# Patient Record
Sex: Female | Born: 1995 | Hispanic: No | Marital: Married | Smoking: Never smoker
Health system: Southern US, Community
[De-identification: ages and names within clinical notes are randomized; demographics above are authoritative.]

## PROBLEM LIST (undated history)

## (undated) ENCOUNTER — Inpatient Hospital Stay (HOSPITAL_COMMUNITY): Payer: Self-pay

## (undated) ENCOUNTER — Emergency Department (HOSPITAL_COMMUNITY): Payer: 59

## (undated) DIAGNOSIS — Z789 Other specified health status: Secondary | ICD-10-CM

## (undated) HISTORY — PX: NO PAST SURGERIES: SHX2092

---

## 2016-08-20 NOTE — L&D Delivery Note (Signed)
Delivery Note At 3:01 AM a viable female was delivered via Vaginal, Spontaneous (Presentation: LOA).  APGAR: 8, 9; weight: pending .   Placenta status: spont ,intact .  Cord: 3 vessel  Anesthesia:  Epidural & 1% lidocaine Episiotomy: None   Lacerations: 1st degree;Perineal Suture Repair: 3.0 vicryl rapide Est. Blood Loss (mL): 100  Mom to postpartum.  Baby to Couplet care / Skin to Skin.  Cam HaiSHAW, Tara Chapman CNM 07/25/2017, 4:08 AM  Please schedule this patient for PP visit in: 4 weeks Low risk pregnancy complicated by: nothing Delivery mode:  SVD Anticipated Birth Control:  other/unsure PP Procedures needed: none  Schedule Integrated BH visit: no Provider: Any provider

## 2017-01-20 ENCOUNTER — Inpatient Hospital Stay (HOSPITAL_COMMUNITY)
Admission: AD | Admit: 2017-01-20 | Discharge: 2017-01-20 | Disposition: A | Discharge status: Home / Self Care | Payer: Medicaid Other | Source: Ambulatory Visit | Attending: Obstetrics and Gynecology | Admitting: Obstetrics and Gynecology

## 2017-01-20 ENCOUNTER — Encounter (HOSPITAL_COMMUNITY): Payer: Self-pay

## 2017-01-20 DIAGNOSIS — Z3A13 13 weeks gestation of pregnancy: Secondary | ICD-10-CM | POA: Diagnosis not present

## 2017-01-20 DIAGNOSIS — O99711 Diseases of the skin and subcutaneous tissue complicating pregnancy, first trimester: Secondary | ICD-10-CM | POA: Insufficient documentation

## 2017-01-20 DIAGNOSIS — L249 Irritant contact dermatitis, unspecified cause: Secondary | ICD-10-CM | POA: Diagnosis not present

## 2017-01-20 DIAGNOSIS — O9989 Other specified diseases and conditions complicating pregnancy, childbirth and the puerperium: Secondary | ICD-10-CM

## 2017-01-20 DIAGNOSIS — L298 Other pruritus: Secondary | ICD-10-CM | POA: Diagnosis present

## 2017-01-20 LAB — COMPREHENSIVE METABOLIC PANEL
ALBUMIN: 3.9 g/dL (ref 3.5–5.0)
ALK PHOS: 40 U/L (ref 38–126)
ALT: 14 U/L (ref 14–54)
ANION GAP: 9 (ref 5–15)
AST: 20 U/L (ref 15–41)
BUN: 6 mg/dL (ref 6–20)
CALCIUM: 9.6 mg/dL (ref 8.9–10.3)
CO2: 24 mmol/L (ref 22–32)
Chloride: 99 mmol/L — ABNORMAL LOW (ref 101–111)
Creatinine, Ser: 0.57 mg/dL (ref 0.44–1.00)
GFR calc non Af Amer: >60 mL/min (ref >60)
GLUCOSE: 82 mg/dL (ref 65–99)
POTASSIUM: 3.7 mmol/L (ref 3.5–5.1)
SODIUM: 132 mmol/L — AB (ref 135–145)
Total Bilirubin: 0.2 mg/dL — ABNORMAL LOW (ref 0.3–1.2)
Total Protein: 8.5 g/dL — ABNORMAL HIGH (ref 6.5–8.1)

## 2017-01-20 MED ORDER — HYDROXYZINE PAMOATE 25 MG PO CAPS: 25.0000 mg | ORAL_CAPSULE | Freq: Three times a day (TID) | ORAL | 0 refills | Status: DC | PRN

## 2017-01-20 MED ORDER — HYDROXYZINE HCL 25 MG PO TABS
25.0000 mg | ORAL_TABLET | Freq: Once | ORAL | Status: AC
  Administered 2017-01-20: 25 mg via ORAL
  Filled 2017-01-20: qty 1

## 2017-01-20 NOTE — ED Notes (Signed)
Urine sent to lab 

## 2017-01-20 NOTE — MAU Note (Signed)
Patient from [place] has been getting prenatal care there, arrived to states, having itching all over her body x 4 days[facility]

## 2017-01-20 NOTE — Discharge Instructions (Signed)
Contact Dermatitis Dermatitis is redness, soreness, and swelling (inflammation) of the skin. Contact dermatitis is a reaction to certain substances that touch the skin. You either touched something that irritated your skin, or you have allergies to something you touched. Follow these instructions at home: Skin Care  Moisturize your skin as needed.  Apply cool compresses to the affected areas.  Try taking a bath with: ? Epsom salts. Follow the instructions on the package. You can get these at a pharmacy or grocery store. ? Baking soda. Pour a small amount into the bath as told by your doctor. ? Colloidal oatmeal. Follow the instructions on the package. You can get this at a pharmacy or grocery store.  Try applying baking soda paste to your skin. Stir water into baking soda until it looks like paste.  Do not scratch your skin.  Bathe less often.  Bathe in lukewarm water. Avoid using hot water. Medicines  Take or apply over-the-counter and prescription medicines only as told by your doctor.  If you were prescribed an antibiotic medicine, take or apply your antibiotic as told by your doctor. Do not stop taking the antibiotic even if your condition starts to get better. General instructions  Keep all follow-up visits as told by your doctor. This is important.  Avoid the substance that caused your reaction. If you do not know what caused it, keep a journal to try to track what caused it. Write down: ? What you eat. ? What cosmetic products you use. ? What you drink. ? What you wear in the affected area. This includes jewelry.  If you were given a bandage (dressing), take care of it as told by your doctor. This includes when to change and remove it. Contact a doctor if:  You do not get better with treatment.  Your condition gets worse.  You have signs of infection such as: ? Swelling. ? Tenderness. ? Redness. ? Soreness. ? Warmth.  You have a fever.  You have new  symptoms. Get help right away if:  You have a very bad headache.  You have neck pain.  Your neck is stiff.  You throw up (vomit).  You feel very sleepy.  You see red streaks coming from the affected area.  Your bone or joint underneath the affected area becomes painful after the skin has healed.  The affected area turns darker.  You have trouble breathing. This information is not intended to replace advice given to you by your health care provider. Make sure you discuss any questions you have with your health care provider. Document Released: 06/03/2009 Document Revised: 01/12/2016 Document Reviewed: 12/22/2014 Elsevier Interactive Patient Education  2018 Elsevier Inc.  

## 2017-01-20 NOTE — MAU Provider Note (Signed)
Patient Tara Chapman is a 21 y.o. G1P0 At 5545w2d by LMP here with complaints of itching and vesicles over her body. Patient has gotten prenatal care in [place] but has not had her prenatal visit with [facility] yet (first appt is on 01-30-2017)  Patient denies that anyone in her house has experienced the same thing; also denies increased itching at night.  History     CSN: [id number]  Arrival date and time: 01/20/17 1607   First Provider Initiated Contact with Patient 01/20/17 1656      Chief Complaint  Patient presents with  . Pruritis   HPI Patient says that she stopped using Dove on May 11 and is now using a new soap (doesn't know the name). She started itching and breaking out in small pustules all over her body over the past four days.  OB History    Gravida Para Term Preterm AB Living   1             SAB TAB Ectopic Multiple Live Births                  No past medical history on file.  No past surgical history on file.  No family history on file.  Social History  Substance Use Topics  . Smoking status: Never Smoker  . Smokeless tobacco: Never Used  . Alcohol use No    Allergies: Allergies not on file  No prescriptions prior to admission.    Review of Systems  Respiratory: Negative.   Cardiovascular: Negative.   Gastrointestinal: Negative.   Genitourinary: Negative.   Skin: Positive for rash.  Psychiatric/Behavioral: Negative.    Physical Exam   Blood pressure 122/78, pulse 88, temperature 98.4 F (36.9 C), temperature source Oral, resp. rate 16.  Physical Exam  Constitutional: She is oriented to person, place, and time. She appears well-developed.  HENT:  Head: Normocephalic.  Neck: Normal range of motion.  Respiratory: Effort normal.  GI: Soft.  Neurological: She is alert and oriented to person, place, and time.  Skin: Rash noted.  Small red acne-like pustules scattered over limbs, trunk, chest and back. No heat or and no tenderness. No  weeping although some pustules appear to have scabbed over from itching.  Pustules are not concentrated in inguinal folds or other skin folds. Pustules are small and pinpoint red; do not have bright round circles characteristic of chicken pox.   MAU Course  Procedures  MDM -vistaril for itching, patient feels better.  -FHR at 173 -CMP and bile acids drawn Assessment and Plan   1. Irritant contact dermatitis, unspecified trigger    2. Patient stable to discharge with instructions to keep appt at clinic on 01-30-2017.  3. Reviewed warning signs and when to return to MAU (bleeding, increased abdominal pain.  If pustules do not resolve within 3 days after discontinuing soap [facility]  4. Rx for vistaril given.  Charlesetta GaribaldiKathryn Lorraine Kooistra CNM 01/20/2017, 5:13 PM

## 2017-01-22 LAB — BILE ACIDS, TOTAL: BILE ACIDS TOTAL: 7.1 umol/L (ref 4.7–24.5)

## 2017-01-30 ENCOUNTER — Encounter: Payer: Self-pay | Admitting: Advanced Practice Midwife

## 2017-01-30 ENCOUNTER — Other Ambulatory Visit (HOSPITAL_COMMUNITY)
Admission: RE | Admit: 2017-01-30 | Discharge: 2017-01-30 | Disposition: A | Discharge status: Home / Self Care | Payer: Medicaid Other | Source: Ambulatory Visit | Attending: Advanced Practice Midwife | Admitting: Advanced Practice Midwife

## 2017-01-30 ENCOUNTER — Ambulatory Visit (INDEPENDENT_AMBULATORY_CARE_PROVIDER_SITE_OTHER): Payer: Medicaid Other | Admitting: Advanced Practice Midwife

## 2017-01-30 DIAGNOSIS — Z3480 Encounter for supervision of other normal pregnancy, unspecified trimester: Secondary | ICD-10-CM | POA: Diagnosis present

## 2017-01-30 DIAGNOSIS — Z113 Encounter for screening for infections with a predominantly sexual mode of transmission: Secondary | ICD-10-CM

## 2017-01-30 DIAGNOSIS — Z3403 Encounter for supervision of normal first pregnancy, third trimester: Secondary | ICD-10-CM

## 2017-01-30 DIAGNOSIS — Z124 Encounter for screening for malignant neoplasm of cervix: Secondary | ICD-10-CM | POA: Diagnosis not present

## 2017-01-30 DIAGNOSIS — Z603 Acculturation difficulty: Secondary | ICD-10-CM | POA: Diagnosis not present

## 2017-01-30 MED ORDER — PRENATAL VITAMINS 0.8 MG PO TABS: 1.0000 | ORAL_TABLET | Freq: Every day | ORAL | 12 refills | Status: DC

## 2017-01-31 ENCOUNTER — Encounter: Payer: Self-pay | Admitting: Advanced Practice Midwife

## 2017-01-31 DIAGNOSIS — Z603 Acculturation difficulty: Secondary | ICD-10-CM | POA: Insufficient documentation

## 2017-01-31 LAB — PRENATAL PROFILE I(LABCORP)
Antibody Screen: NEGATIVE
BASOS ABS: 0 10*3/uL (ref 0.0–0.2)
Basos: 1 %
EOS (ABSOLUTE): 0.1 10*3/uL (ref 0.0–0.4)
Eos: 1 %
HEMOGLOBIN: 11.1 g/dL (ref 11.1–15.9)
Hematocrit: 33.6 % — ABNORMAL LOW (ref 34.0–46.6)
Hepatitis B Surface Ag: NEGATIVE
IMMATURE GRANS (ABS): 0 10*3/uL (ref 0.0–0.1)
Immature Granulocytes: 0 %
LYMPHS: 32 %
Lymphocytes Absolute: 2.3 10*3/uL (ref 0.7–3.1)
MCH: 29.8 pg (ref 26.6–33.0)
MCHC: 33 g/dL (ref 31.5–35.7)
MCV: 90 fL (ref 79–97)
MONOCYTES: 9 %
Monocytes Absolute: 0.6 10*3/uL (ref 0.1–0.9)
Neutrophils Absolute: 4.1 10*3/uL (ref 1.4–7.0)
Neutrophils: 57 %
PLATELETS: 437 10*3/uL — AB (ref 150–379)
RBC: 3.72 x10E6/uL — ABNORMAL LOW (ref 3.77–5.28)
RDW: 13.9 % (ref 12.3–15.4)
RPR Ser Ql: NONREACTIVE
RUBELLA: 20.3 {index} (ref >0.99)
Rh Factor: POSITIVE
WBC: 7.1 10*3/uL (ref 3.4–10.8)

## 2017-01-31 LAB — HIV ANTIBODY (ROUTINE TESTING W REFLEX): HIV Screen 4th Generation wRfx: NONREACTIVE

## 2017-01-31 NOTE — Patient Instructions (Signed)

## 2017-01-31 NOTE — Progress Notes (Signed)
  Subjective:    Tara Chapman is a G1P0 8630w6d being seen today for her first obstetrical visit.  Her obstetrical history is significant for language barrier. Patient does intend to breast feed. Pregnancy history fully reviewed.  Patient reports no complaints.  Vitals:   01/30/17 1000 01/30/17 1010  BP: 110/69   Pulse: 79   Weight: 117 lb 14.4 oz (53.5 kg)   Height:  5\' 5"  (1.651 m)    HISTORY: OB History  Gravida Para Term Preterm AB Living  1            SAB TAB Ectopic Multiple Live Births               # Outcome Date GA Lbr Len/2nd Weight Sex Delivery Anes PTL Lv  1 Current              History reviewed. No pertinent past medical history. History reviewed. No pertinent surgical history. History reviewed. No pertinent family history.   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    Vulva: Bartholin's, Urethra, Skene's normal   Vagina:  normal discharge   pH:    Cervix: nulliparous appearance   Adnexa: normal adnexa and no mass, fullness, tenderness   Bony Pelvis: gynecoid  System: Breast:  normal appearance, no masses or tenderness   Skin: normal coloration and turgor, no rashes    Neurologic: oriented, grossly non-focal   Extremities: normal strength, tone, and muscle mass   HEENT neck supple with midline trachea   Mouth/Teeth mucous membranes moist, pharynx normal without lesions   Neck supple   Cardiovascular: regular rate and rhythm   Respiratory:  appears well, vitals normal, no respiratory distress, acyanotic, normal RR, ear and throat exam is normal, neck free of mass or lymphadenopathy, chest clear, no wheezing, crepitations, rhonchi, normal symmetric air entry   Abdomen: soft, non-tender; bowel sounds normal; no masses,  no organomegaly   Urinary: urethral meatus normal      Assessment:    Pregnancy: G1P0 Patient Active Problem List   Diagnosis Date Noted  . Supervision of other normal pregnancy, antepartum 01/30/2017        Plan:     Initial labs drawn. Prenatal vitamins. Problem list reviewed and updated. Genetic Screening discussed Quad Screen: requested.  Ultrasound discussed; fetal survey: requested.  Follow up in 4 weeks. 50% of 30 min visit spent on counseling and coordination of care.  Routines reviewed Welcomed to practice Interpretor present throughout visit   Wynelle BourgeoisMarie Peggy Monk 01/31/2017

## 2017-02-01 LAB — CULTURE, OB URINE

## 2017-02-01 LAB — CYTOLOGY - PAP
Chlamydia: NEGATIVE
Diagnosis: NEGATIVE
Neisseria Gonorrhea: NEGATIVE

## 2017-02-01 LAB — URINE CULTURE, OB REFLEX

## 2017-02-13 ENCOUNTER — Encounter: Payer: Self-pay | Admitting: Family Medicine

## 2017-02-13 DIAGNOSIS — Z3480 Encounter for supervision of other normal pregnancy, unspecified trimester: Secondary | ICD-10-CM

## 2017-02-28 ENCOUNTER — Ambulatory Visit (INDEPENDENT_AMBULATORY_CARE_PROVIDER_SITE_OTHER): Payer: Medicaid Other | Admitting: Medical

## 2017-02-28 VITALS — BP 114/60 | HR 82 | Wt 121.7 lb

## 2017-02-28 DIAGNOSIS — Z1379 Encounter for other screening for genetic and chromosomal anomalies: Secondary | ICD-10-CM

## 2017-02-28 DIAGNOSIS — Z3482 Encounter for supervision of other normal pregnancy, second trimester: Secondary | ICD-10-CM

## 2017-02-28 DIAGNOSIS — O219 Vomiting of pregnancy, unspecified: Secondary | ICD-10-CM

## 2017-02-28 DIAGNOSIS — Z3480 Encounter for supervision of other normal pregnancy, unspecified trimester: Secondary | ICD-10-CM

## 2017-02-28 MED ORDER — PROMETHAZINE HCL 12.5 MG PO TABS: 12.5000 mg | ORAL_TABLET | Freq: Four times a day (QID) | ORAL | 0 refills | Status: DC | PRN

## 2017-02-28 NOTE — Patient Instructions (Signed)
Second Trimester of Pregnancy The second trimester is from week 13 through week 28, month 4 through 6. This is often the time in pregnancy that you feel your best. Often times, morning sickness has lessened or quit. You may have more energy, and you may get hungry more often. Your unborn baby (fetus) is growing rapidly. At the end of the sixth month, he or she is about 9 inches long and weighs about 1 pounds. You will likely feel the baby move (quickening) between 18 and 20 weeks of pregnancy. Follow these instructions at home:  Avoid all smoking, herbs, and alcohol. Avoid drugs not approved by your doctor.  Do not use any tobacco products, including cigarettes, chewing tobacco, and electronic cigarettes. If you need help quitting, ask your doctor. You may get counseling or other support to help you quit.  Only take medicine as told by your doctor. Some medicines are safe and some are not during pregnancy.  Exercise only as told by your doctor. Stop exercising if you start having cramps.  Eat regular, healthy meals.  Wear a good support bra if your breasts are tender.  Do not use hot tubs, steam rooms, or saunas.  Wear your seat belt when driving.  Avoid raw meat, uncooked cheese, and liter boxes and soil used by cats.  Take your prenatal vitamins.  Take 1500-2000 milligrams of calcium daily starting at the 20th week of pregnancy until you deliver your baby.  Try taking medicine that helps you poop (stool softener) as needed, and if your doctor approves. Eat more fiber by eating fresh fruit, vegetables, and whole grains. Drink enough fluids to keep your pee (urine) clear or pale yellow.  Take warm water baths (sitz baths) to soothe pain or discomfort caused by hemorrhoids. Use hemorrhoid cream if your doctor approves.  If you have puffy, bulging veins (varicose veins), wear support hose. Raise (elevate) your feet for 15 minutes, 3-4 times a day. Limit salt in your diet.  Avoid heavy  lifting, wear low heals, and sit up straight.  Rest with your legs raised if you have leg cramps or low back pain.  Visit your dentist if you have not gone during your pregnancy. Use a soft toothbrush to brush your teeth. Be gentle when you floss.  You can have sex (intercourse) unless your doctor tells you not to.  Go to your doctor visits. Get help if:  You feel dizzy.  You have mild cramps or pressure in your lower belly (abdomen).  You have a nagging pain in your belly area.  You continue to feel sick to your stomach (nauseous), throw up (vomit), or have watery poop (diarrhea).  You have bad smelling fluid coming from your vagina.  You have pain with peeing (urination). Get help right away if:  You have a fever.  You are leaking fluid from your vagina.  You have spotting or bleeding from your vagina.  You have severe belly cramping or pain.  You lose or gain weight rapidly.  You have trouble catching your breath and have chest pain.  You notice sudden or extreme puffiness (swelling) of your face, hands, ankles, feet, or legs.  You have not felt the baby move in over an hour.  You have severe headaches that do not go away with medicine.  You have vision changes. This information is not intended to replace advice given to you by your health care provider. Make sure you discuss any questions you have with your health care   provider. Document Released: 10/31/2009 Document Revised: 01/12/2016 Document Reviewed: 10/07/2012 Elsevier Interactive Patient Education  2017 Elsevier Inc.  

## 2017-02-28 NOTE — Progress Notes (Signed)
   PRENATAL VISIT NOTE  Subjective:  Tara Chapman is a 21 y.o. G1P0 at 4776w6d being seen today for ongoing prenatal care.  She is currently monitored for the following issues for this low-risk pregnancy and has Supervision of other normal pregnancy, antepartum and Language barrier, cultural differences on her problem list.  Patient reports nausea, vomiting and SOB when laying down at night.  Contractions: Irritability. Vag. Bleeding: None.  Movement: Present. Denies leaking of fluid.   The following portions of the patient's history were reviewed and updated as appropriate: allergies, current medications, past family history, past medical history, past social history, past surgical history and problem list. Problem list updated.  Objective:   Vitals:   02/28/17 0917  BP: 114/60  Pulse: 82  Weight: 121 lb 11.2 oz (55.2 kg)    Fetal Status: Fetal Heart Rate (bpm): 155   Movement: Present     General:  Alert, oriented and cooperative. Patient is in no acute distress.  Skin: Skin is warm and dry. No rash noted.   Cardiovascular: Normal heart rate noted, normal rhythm   Respiratory: Normal respiratory effort, no problems with respiration noted Clear to auscultation in all lung fields, no adventitious sounds  Abdomen: Soft, gravid, appropriate for gestational age. Pain/Pressure: Present     Pelvic:  Cervical exam deferred        Extremities: Normal range of motion.  Edema: None  Mental Status: Normal mood and affect. Normal behavior. Normal judgment and thought content.   Assessment and Plan:  Pregnancy: G1P0 at 6076w6d  1. Supervision of other normal pregnancy, antepartum - US MFM OB COMP + 14 WK; Schedule   2. Nausea and vomiting in pregnancy prior to [redacted] weeks gestation - promethazine (PHENERGAN) 12.5 MG tablet; Take 1 tablet (12.5 mg total) by mouth every 6 (six) hours as needed for nausea or vomiting.  Dispense: 30 tablet; Refill: 0  Second trimester warning  symptoms and general obstetric precautions including but not limited to vaginal bleeding, contractions, leaking of fluid and fetal movement were reviewed in detail with the patient. Please refer to After Visit Summary for other counseling recommendations.  Return in about 4 weeks (around 03/28/2017) for LOB.   Vonzella NippleJulie Crysten Kaman, PA-C

## 2017-02-28 NOTE — Progress Notes (Signed)
Patient complains of difficulty breathing which happens at night when she's getting ready to fall asleep , also patient complains of vomiting and seeing blood in it which happens every day.

## 2017-03-11 ENCOUNTER — Other Ambulatory Visit: Payer: Self-pay | Admitting: Medical

## 2017-03-11 ENCOUNTER — Ambulatory Visit (HOSPITAL_COMMUNITY)
Admission: RE | Admit: 2017-03-11 | Discharge: 2017-03-11 | Disposition: A | Discharge status: Home / Self Care | Payer: Medicaid Other | Source: Ambulatory Visit | Attending: Medical | Admitting: Medical

## 2017-03-11 DIAGNOSIS — Z363 Encounter for antenatal screening for malformations: Secondary | ICD-10-CM

## 2017-03-11 DIAGNOSIS — Z3A20 20 weeks gestation of pregnancy: Secondary | ICD-10-CM | POA: Insufficient documentation

## 2017-03-11 DIAGNOSIS — Z3480 Encounter for supervision of other normal pregnancy, unspecified trimester: Secondary | ICD-10-CM

## 2017-03-11 LAB — AFP TETRA
DIA Mom Value: 1.16
DIA Value (EIA): 224.33 pg/mL
DSR (By Age)    1 IN: 1094
DSR (Second Trimester) 1 IN: 10000
GESTATIONAL AGE AFP: 18 wk
MSAFP Mom: 1.49
MSAFP: 73.9 ng/mL
MSHCG Mom: 2.96
MSHCG: 94440 m[IU]/mL
Maternal Age At EDD: 23.3 yr
Osb Risk: 2809
Test Results:: NEGATIVE
UE3 MOM: 1.25
UE3 VALUE: 1.7 ng/mL
Weight: 121 [lb_av]

## 2017-03-27 ENCOUNTER — Ambulatory Visit (INDEPENDENT_AMBULATORY_CARE_PROVIDER_SITE_OTHER): Payer: Medicaid Other | Admitting: Advanced Practice Midwife

## 2017-03-27 ENCOUNTER — Encounter: Payer: Self-pay | Admitting: Advanced Practice Midwife

## 2017-03-27 VITALS — BP 111/59 | HR 82 | Wt 126.6 lb

## 2017-03-27 DIAGNOSIS — Z3480 Encounter for supervision of other normal pregnancy, unspecified trimester: Secondary | ICD-10-CM

## 2017-03-27 DIAGNOSIS — Z3482 Encounter for supervision of other normal pregnancy, second trimester: Secondary | ICD-10-CM

## 2017-03-27 NOTE — Patient Instructions (Signed)
Second Trimester of Pregnancy The second trimester is from week 13 through week 28, month 4 through 6. This is often the time in pregnancy that you feel your best. Often times, morning sickness has lessened or quit. You may have more energy, and you may get hungry more often. Your unborn baby (fetus) is growing rapidly. At the end of the sixth month, he or she is about 9 inches long and weighs about 1 pounds. You will likely feel the baby move (quickening) between 18 and 20 weeks of pregnancy. Follow these instructions at home:  Avoid all smoking, herbs, and alcohol. Avoid drugs not approved by your doctor.  Do not use any tobacco products, including cigarettes, chewing tobacco, and electronic cigarettes. If you need help quitting, ask your doctor. You may get counseling or other support to help you quit.  Only take medicine as told by your doctor. Some medicines are safe and some are not during pregnancy.  Exercise only as told by your doctor. Stop exercising if you start having cramps.  Eat regular, healthy meals.  Wear a good support bra if your breasts are tender.  Do not use hot tubs, steam rooms, or saunas.  Wear your seat belt when driving.  Avoid raw meat, uncooked cheese, and liter boxes and soil used by cats.  Take your prenatal vitamins.  Take 1500-2000 milligrams of calcium daily starting at the 20th week of pregnancy until you deliver your baby.  Try taking medicine that helps you poop (stool softener) as needed, and if your doctor approves. Eat more fiber by eating fresh fruit, vegetables, and whole grains. Drink enough fluids to keep your pee (urine) clear or pale yellow.  Take warm water baths (sitz baths) to soothe pain or discomfort caused by hemorrhoids. Use hemorrhoid cream if your doctor approves.  If you have puffy, bulging veins (varicose veins), wear support hose. Raise (elevate) your feet for 15 minutes, 3-4 times a day. Limit salt in your diet.  Avoid heavy  lifting, wear low heals, and sit up straight.  Rest with your legs raised if you have leg cramps or low back pain.  Visit your dentist if you have not gone during your pregnancy. Use a soft toothbrush to brush your teeth. Be gentle when you floss.  You can have sex (intercourse) unless your doctor tells you not to.  Go to your doctor visits. Get help if:  You feel dizzy.  You have mild cramps or pressure in your lower belly (abdomen).  You have a nagging pain in your belly area.  You continue to feel sick to your stomach (nauseous), throw up (vomit), or have watery poop (diarrhea).  You have bad smelling fluid coming from your vagina.  You have pain with peeing (urination). Get help right away if:  You have a fever.  You are leaking fluid from your vagina.  You have spotting or bleeding from your vagina.  You have severe belly cramping or pain.  You lose or gain weight rapidly.  You have trouble catching your breath and have chest pain.  You notice sudden or extreme puffiness (swelling) of your face, hands, ankles, feet, or legs.  You have not felt the baby move in over an hour.  You have severe headaches that do not go away with medicine.  You have vision changes. This information is not intended to replace advice given to you by your health care provider. Make sure you discuss any questions you have with your health care   provider. Document Released: 10/31/2009 Document Revised: 01/12/2016 Document Reviewed: 10/07/2012 Elsevier Interactive Patient Education  2017 Elsevier Inc.  

## 2017-03-27 NOTE — Progress Notes (Signed)
Patient states of having a  yellow discharge  with a fishy smelling odor.

## 2017-03-28 ENCOUNTER — Encounter: Payer: Self-pay | Admitting: Advanced Practice Midwife

## 2017-03-28 NOTE — Progress Notes (Signed)
   PRENATAL VISIT NOTE  Subjective:  Tara Chapman is a 21 y.o. G1P0 at 420w6d being seen today for ongoing prenatal care.  She is currently monitored for the following issues for this low-risk pregnancy and has Supervision of other normal pregnancy, antepartum and Language barrier, cultural differences on her problem list.  Patient reports no complaints.  Contractions: Irritability. Vag. Bleeding: None.  Movement: Present. Denies leaking of fluid.   The following portions of the patient's history were reviewed and updated as appropriate: allergies, current medications, past family history, past medical history, past social history, past surgical history and problem list. Problem list updated.  Objective:   Vitals:   03/27/17 0751  BP: (!) 111/59  Pulse: 82  Weight: 126 lb 9.6 oz (57.4 kg)    Fetal Status: Fetal Heart Rate (bpm): 150   Movement: Present     General:  Alert, oriented and cooperative. Patient is in no acute distress.  Skin: Skin is warm and dry. No rash noted.   Cardiovascular: Normal heart rate noted  Respiratory: Normal respiratory effort, no problems with respiration noted  Abdomen: Soft, gravid, appropriate for gestational age.  Pain/Pressure: Present     Pelvic: Cervical exam deferred        Extremities: Normal range of motion.  Edema: None  Mental Status:  Normal mood and affect. Normal behavior. Normal judgment and thought content.   Assessment and Plan:  Pregnancy: G1P0 at 2320w6d  1. Supervision of other normal pregnancy, antepartum      Reviewed US findings, normal      GTT next visit  Preterm labor symptoms and general obstetric precautions including but not limited to vaginal bleeding, contractions, leaking of fluid and fetal movement were reviewed in detail with the patient. Please refer to After Visit Summary for other counseling recommendations.  Return in about 4 weeks (around 04/24/2017) for Low Risk [facility].   Wynelle BourgeoisMarie Sumiya Mamaril,  CNM

## 2017-04-17 ENCOUNTER — Ambulatory Visit (INDEPENDENT_AMBULATORY_CARE_PROVIDER_SITE_OTHER): Payer: Medicaid Other | Admitting: Advanced Practice Midwife

## 2017-04-17 VITALS — BP 109/62 | HR 84 | Wt 134.7 lb

## 2017-04-17 DIAGNOSIS — IMO0002 Reserved for concepts with insufficient information to code with codable children: Secondary | ICD-10-CM

## 2017-04-17 DIAGNOSIS — Z048 Encounter for examination and observation for other specified reasons: Secondary | ICD-10-CM

## 2017-04-17 DIAGNOSIS — Z3A26 26 weeks gestation of pregnancy: Secondary | ICD-10-CM

## 2017-04-17 DIAGNOSIS — Z0489 Encounter for examination and observation for other specified reasons: Secondary | ICD-10-CM

## 2017-04-17 DIAGNOSIS — Z3480 Encounter for supervision of other normal pregnancy, unspecified trimester: Secondary | ICD-10-CM

## 2017-04-17 NOTE — Patient Instructions (Signed)
Glucose Tolerance Test The glucose tolerance test (GTT) is one of several tests used to diagnose diabetes mellitus. The GTT is a blood test, and it may include a urine test as well. The GTT checks to see how your body processes sugar (glucose). For this test, you will consume a drink containing a high level of glucose. Your blood glucose levels will be checked before you consume the drink and then again 1, 2, 3, and possibly 4 hours after you consume it. Your health care provider may recommend that you have the GTT if you:  Have a family history of diabetes.  Are very overweight (obese).  Have experienced infections that keep coming back.  Have had numerous cuts or wounds that did not heal quickly, especially on your legs and feet.  Are a woman and have a history of giving birth to very large babies or a history of repeated fetal loss (stillbirth).  Have had glucose in your urine or high blood sugar: ? During pregnancy. ? After a heart attack, surgery, or prolonged periods of high stress.  The GTT lasts 3-4 hours. Other than the glucose solution, you will not be allowed to eat or drink anything during the test. You must remain at the testing location to make sure that your blood and urine samples are taken on time. How do I prepare for this test? Eat normally for 3 days prior to the GTT test, including having plenty of carbohydrate-rich foods. Do not eat or drink anything except water during the final 12 hours before the test. You should not smoke or exercise during the test. In addition, your health care provider may ask you to stop taking certain medicines before the test. What do the results mean? It is your responsibility to obtain your test results. Ask the lab or department performing the test when and how you will get your results. Contact your health care provider to discuss any questions you have about your results. Range of Normal Values Ranges for normal values may vary among  different labs and hospitals. You should always check with your health care provider after having lab work or other tests done to discuss whether your values are considered within normal limits. Normal levels of blood glucose are as follows:  Fasting: less than 110 mg/dL or less than 6.1 mmol/L (SI units).  1 hour after consuming the glucose drink: less than 200 mg/dL or less than 11.1 mmol/L.  2 hours after consuming the glucose drink: less than 140 mg/dL or less than 7.8 mmol/L.  3 hours after consuming the glucose drink: 70-115 mg/dL or less than 6.4 mmol/L.  4 hours after consuming the glucose drink: 70-115 mg/dL or less than 6.4 mmol/L.  The normal result for the urine test is negative, meaning that glucose is absent from your urine. Some substances can interfere with GTT results. These may include:  Blood pressure and heart failure medicines, including beta blockers, furosemide, and thiazides.  Anti-inflammatory medicines, including aspirin.  Nicotine.  Some psychiatric medicines.  Oral contraceptives.  Diuretics or corticosteroids.  Meaning of Results Outside Normal Value Ranges GTT test results that are above normal values may indicate health problems, such as:  Diabetes mellitus.  Acute stress response.  Cushing syndrome.  Tumors such as pheochromocytoma or glucagonoma.  Chronic renal failure.  Pancreatitis.  Hyperthyroidism.  Current infection.  Discuss your test results with your health care provider. He or she will use the results to make a diagnosis and determine a treatment plan  that is right for you. Talk with your health care provider to discuss your results, treatment options, and if necessary, the need for more tests. Talk with your health care provider if you have any questions about your results. This information is not intended to replace advice given to you by your health care provider. Make sure you discuss any questions you have with your  health care provider. Document Released: 08/29/2004 Document Revised: 04/10/2016 Document Reviewed: 12/11/2013 Elsevier Interactive Patient Education  2017 Elsevier Inc.  

## 2017-04-17 NOTE — Progress Notes (Signed)
   PRENATAL VISIT NOTE  Subjective:  Tara Chapman is a 21 y.o. G1P0 at [redacted]w[redacted]d being seen today for ongoing prenatal care.  She is currently monitored for the following issues for this low-risk pregnancy and has Supervision of other normal pregnancy, antepartum and Language barrier, cultural differences on her problem list.  Patient reports no complaints.  Contractions: Not present. Vag. Bleeding: None.  Movement: Present. Denies leaking of fluid.   The following portions of the patient's history were reviewed and updated as appropriate: allergies, current medications, past family history, past medical history, past social history, past surgical history and problem list. Problem list updated.  Pt was told to come fasting for GTT. She was scheduled a little bit too early for GTT today, but strongly prefers to do it since she is fasting and has the time. She also has no risk factors for GDM.   Objective:   Vitals:   04/17/17 0904  BP: 109/62  Pulse: 84  Weight: 134 lb 11.2 oz (61.1 kg)    Fetal Status: Fetal Heart Rate (bpm): 155 Fundal Height: 26 cm Movement: Present     General:  Alert, oriented and cooperative. Patient is in no acute distress.  Skin: Skin is warm and dry. No rash noted.   Cardiovascular: Normal heart rate noted  Respiratory: Normal respiratory effort, no problems with respiration noted  Abdomen: Soft, gravid, appropriate for gestational age.  Pain/Pressure: Present     Pelvic: Cervical exam deferred        Extremities: Normal range of motion.  Edema: None  Mental Status:  Normal mood and affect. Normal behavior. Normal judgment and thought content.   Assessment and Plan:  Pregnancy: G1P0 at [redacted]w[redacted]d  1. [redacted] weeks gestation of pregnancy  - [place] MFM OB FOLLOW UP; Future - Glucose Tolerance, 2 Hours w/1 Hour--early per pt strong preference. Consider random CBG at future visits.  - RPR - CBC - HIV antibody  2. Evaluate anatomy not seen on prior  sonogram  - [place] MFM OB FOLLOW UP; Future  Preterm labor symptoms and general obstetric precautions including but not limited to vaginal bleeding, contractions, leaking of fluid and fetal movement were reviewed in detail with the patient. Please refer to After Visit Summary for other counseling recommendations.  Return for ROB. 2 weeks TDaP [place]   [place], [place]

## 2017-04-18 LAB — CBC
HEMOGLOBIN: 10.2 g/dL — AB (ref 11.1–15.9)
Hematocrit: 30.3 % — ABNORMAL LOW (ref 34.0–46.6)
MCH: 31.9 pg (ref 26.6–33.0)
MCHC: 33.7 g/dL (ref 31.5–35.7)
MCV: 95 fL (ref 79–97)
PLATELETS: 359 10*3/uL (ref 150–379)
RBC: 3.2 x10E6/uL — ABNORMAL LOW (ref 3.77–5.28)
RDW: 13.9 % (ref 12.3–15.4)
WBC: 11.2 10*3/uL — ABNORMAL HIGH (ref 3.4–10.8)

## 2017-04-18 LAB — HIV ANTIBODY (ROUTINE TESTING W REFLEX): HIV SCREEN 4TH GENERATION: NONREACTIVE

## 2017-04-18 LAB — RPR: RPR: NONREACTIVE

## 2017-04-18 LAB — GLUCOSE TOLERANCE, 2 HOURS W/ 1HR
GLUCOSE, 1 HOUR: 127 mg/dL (ref 65–179)
GLUCOSE, 2 HOUR: 123 mg/dL (ref 65–152)
GLUCOSE, FASTING: 86 mg/dL (ref 65–91)

## 2017-04-24 ENCOUNTER — Ambulatory Visit (HOSPITAL_COMMUNITY)
Admission: RE | Admit: 2017-04-24 | Discharge: 2017-04-24 | Disposition: A | Discharge status: Home / Self Care | Payer: Medicaid Other | Source: Ambulatory Visit | Attending: Advanced Practice Midwife | Admitting: Advanced Practice Midwife

## 2017-04-24 DIAGNOSIS — Z362 Encounter for other antenatal screening follow-up: Secondary | ICD-10-CM | POA: Insufficient documentation

## 2017-04-24 DIAGNOSIS — Z0489 Encounter for examination and observation for other specified reasons: Secondary | ICD-10-CM

## 2017-04-24 DIAGNOSIS — Z3A26 26 weeks gestation of pregnancy: Secondary | ICD-10-CM | POA: Diagnosis not present

## 2017-04-24 DIAGNOSIS — IMO0002 Reserved for concepts with insufficient information to code with codable children: Secondary | ICD-10-CM

## 2017-04-24 DIAGNOSIS — Z048 Encounter for examination and observation for other specified reasons: Secondary | ICD-10-CM | POA: Diagnosis present

## 2017-05-01 ENCOUNTER — Ambulatory Visit (INDEPENDENT_AMBULATORY_CARE_PROVIDER_SITE_OTHER): Payer: Medicaid Other | Admitting: Advanced Practice Midwife

## 2017-05-01 ENCOUNTER — Encounter: Payer: Self-pay | Admitting: Advanced Practice Midwife

## 2017-05-01 VITALS — BP 113/63 | HR 85 | Wt 136.0 lb

## 2017-05-01 DIAGNOSIS — Z23 Encounter for immunization: Secondary | ICD-10-CM | POA: Diagnosis not present

## 2017-05-01 DIAGNOSIS — Z603 Acculturation difficulty: Secondary | ICD-10-CM | POA: Diagnosis not present

## 2017-05-01 DIAGNOSIS — Z3480 Encounter for supervision of other normal pregnancy, unspecified trimester: Secondary | ICD-10-CM

## 2017-05-01 DIAGNOSIS — Z3482 Encounter for supervision of other normal pregnancy, second trimester: Secondary | ICD-10-CM | POA: Diagnosis not present

## 2017-05-01 MED ORDER — TETANUS-DIPHTH-ACELL PERTUSSIS 5-2.5-18.5 LF-MCG/0.5 IM SUSP
0.5000 mL | Freq: Once | INTRAMUSCULAR | Status: AC
  Administered 2017-05-01: 0.5 mL via INTRAMUSCULAR

## 2017-05-01 NOTE — Patient Instructions (Addendum)
AREA PEDIATRIC/FAMILY PRACTICE PHYSICIANS  [facility] [address], [address] [place], [place]  [address] Phone - [phone]   Fax - [phone]  [facility] [address] [place], [place] [address] Phone - [phone]   Fax - [phone]  JACK AMOS [address] [place], [place]  [address] Phone - [phone]   Fax - [phone]  [facility] [address] [place], [place]  [address] Phone - [phone]   Fax - [phone]  [facility] [address] [place], [place]  [address] Phone - [phone]   Fax - [phone]  [facility] [address], [address] [place], [place]  [address] Phone - [phone]   Fax - [phone]  [facility] [address], [address] [place], [place]  [address] Phone - [phone]   Fax - [phone]  [facility] [address] [place], [place]  [address] Phone - [phone]   Fax - [phone]  [facility] [address] [place], [place]  [address] Phone - [phone]   Fax - [phone] [facility] [address] [place], [place]  [address] Phone - [phone]   Fax - [phone]  [facility] [address] [place], [place]  [address] Phone - [phone]   Fax - [phone]  [facility] [address], [address] [place], [place]  [address] Phone - [phone]   Fax - [phone]  [facility] [address] [place], [place]  [address] Phone - [phone]   Fax - [phone]  [facility] [address], [address] [place], [place]  [address] Phone - [phone]  [facility] [address] [place], [place]  [address] Phone - [phone]   Fax - [phone]  [facility] [address], [address] [place], [place]  [address] Phone - [phone]   Fax - [phone]  [facility] [address] [place], [place]  [address] Phone - [phone]   Fax - [phone]  [facility] [address] [place], [place]  [address] Phone - [phone]   Fax - [phone]   [facility] [address] [place], [place]  [address] Phone - [phone]   Fax - [phone]  [facility] [address], [address] [place], [place]  [address] Phone - [phone]   Fax - [phone]  DAVID RUBIN [address] [place], [place]  [address] Phone - [phone]   Fax - [phone]  [facility] [address], [address] [place], [place]  [address] Phone - [phone]   Fax - [phone]  [facility] [address] [place], [place]  [address] Phone - [phone]   Fax - [phone] [facility] [address] [place], [place]  [address] Phone - [phone]   Fax - [phone]  [facility] [address] [place], [place]  [address] Phone - [phone]   Fax - [phone]  [facility]  [address] [place], [place]  [address] Phone - [phone]   Fax - [phone]  [facility] Wyvonne Lenzharlene Flemming MD [address] [place] [address] Phone [phone]  Fax [phone]   Preterm Labor and Birth Information The normal length of a pregnancy is 39-41 weeks.  Preterm labor is when labor starts before 37 completed weeks of pregnancy. What are the risk factors for preterm labor? Preterm labor is more likely to occur in women who:  Have certain infections during pregnancy such as a bladder infection, sexually transmitted infection, or infection inside the uterus (chorioamnionitis).  Have a shorter-than-normal cervix.  Have gone into preterm labor  before.  Have had surgery on their cervix.  Are younger than age 88 or older than age 65.  Are African American.  Are pregnant with twins or multiple babies (multiple gestation).  Take street drugs or smoke while pregnant.  Do not gain enough weight while pregnant.  Became pregnant shortly after having been pregnant.  What are the symptoms of preterm labor? Symptoms of preterm labor include:  Cramps similar to those that can happen during a menstrual period. The cramps may happen with diarrhea.  Pain in the abdomen or lower back.  Regular uterine contractions that may feel like tightening of the abdomen.  A feeling of increased pressure in the pelvis.  Increased watery or bloody mucus discharge from the vagina.  Water breaking (ruptured amniotic sac).  Why is it important to recognize signs of preterm labor? It is important to recognize signs of preterm labor because babies who are born prematurely may not be fully developed. This can put them at an increased risk for:  Long-term (chronic) heart and lung problems.  Difficulty immediately after birth with regulating body systems, including blood sugar, body temperature, heart rate, and breathing rate.  Bleeding in the brain.  Cerebral palsy.  Learning difficulties.  Death.  These risks are highest for babies who are born before 34 weeks of pregnancy. How is preterm labor treated? Treatment depends on the length of your pregnancy, your condition, and the health of your baby. It may involve:  Having a stitch (suture) placed in your cervix to prevent your cervix from opening too early (cerclage).  Taking or being given medicines, such as: ? Hormone medicines. These may be given early in pregnancy to help support the pregnancy. ? Medicine to stop contractions. ? Medicines to help mature the baby's lungs. These may be prescribed if the risk of delivery is high. ? Medicines to prevent your baby from developing  cerebral palsy.  If the labor happens before 34 weeks of pregnancy, you may need to stay in the hospital. What should I do if I think I am in preterm labor? If you think that you are going into preterm labor, call your health care provider right away. How can I prevent preterm labor in future pregnancies? To increase your chance of having a full-term pregnancy:  Do not use any tobacco products, such as cigarettes, chewing tobacco, and e-cigarettes. If you need help quitting, ask your health care provider.  Do not use street drugs or medicines that have not been prescribed to you during your pregnancy.  Talk with your health care provider before taking any herbal supplements, even if you have been taking them regularly.  Make sure you gain a healthy amount of weight during your pregnancy.  Watch for infection. If you think that you might have an infection, get it checked right away.  Make sure to tell your health care provider if you have gone into preterm labor before.  This information is not intended to replace advice given to you by your health care provider. Make sure you discuss any questions you have with your health care provider. Document Released: 10/27/2003 Document Revised: 01/17/2016  Document Reviewed: 12/28/2015 Elsevier Interactive Patient Education  [employer]2018 Elsevier Inc.

## 2017-05-01 NOTE — Progress Notes (Signed)
   PRENATAL VISIT NOTE  Subjective:  Tara Chapman is a 21 y.o. G1P0 at 2612w5d being seen today for ongoing prenatal care.  She is currently monitored for the following issues for this low-risk pregnancy and has Supervision of other normal pregnancy, antepartum and Language barrier, cultural differences on her problem list.  Patient reports occasional contractions, ~4/day.Contractions: Irregular. Vag. Bleeding: None.  Movement: Present. Denies leaking of fluid.   The following portions of the patient's history were reviewed and updated as appropriate: allergies, current medications, past family history, past medical history, past social history, past surgical history and problem list. Problem list updated.  Objective:   Vitals:   05/01/17 0759  BP: 113/63  Pulse: 85  Weight: 136 lb (61.7 kg)    Fetal Status: Fetal Heart Rate (bpm): 153 Fundal Height: 28 cm Movement: Present     General:  Alert, oriented and cooperative. Patient is in no acute distress.  Skin: Skin is warm and dry. No rash noted.   Cardiovascular: Normal heart rate noted  Respiratory: Normal respiratory effort, no problems with respiration noted  Abdomen: Soft, gravid, appropriate for gestational age.  Pain/Pressure: Absent     Pelvic: Cervical exam deferred        Extremities: Normal range of motion.  Edema: None  Mental Status:  Normal mood and affect. Normal behavior. Normal judgment and thought content.   Assessment and Plan:  Pregnancy: G1P0 at 5812w5d  1. Supervision of other normal pregnancy, antepartum  - TDaP  2. Language barrier, cultural differences   Preterm labor symptoms and general obstetric precautions including but not limited to vaginal bleeding, contractions, leaking of fluid and fetal movement were reviewed in detail with the patient. Please refer to After Visit Summary for other counseling recommendations.  Return in about 2 weeks (around 05/15/2017) for ROB.   Dorathy KinsmanVirginia  Mckinsley Koelzer, CNM

## 2017-05-01 NOTE — Addendum Note (Signed)
Addended by: Gerome ApleyZEYFANG, Georgie Eduardo L on: 05/01/2017 08:48 AM   Modules accepted: Orders

## 2017-05-15 ENCOUNTER — Encounter: Payer: Self-pay | Admitting: Advanced Practice Midwife

## 2017-05-15 ENCOUNTER — Ambulatory Visit (INDEPENDENT_AMBULATORY_CARE_PROVIDER_SITE_OTHER): Payer: Medicaid Other | Admitting: Advanced Practice Midwife

## 2017-05-15 VITALS — BP 123/65 | HR 102 | Wt 139.2 lb

## 2017-05-15 DIAGNOSIS — Z3483 Encounter for supervision of other normal pregnancy, third trimester: Secondary | ICD-10-CM | POA: Diagnosis present

## 2017-05-15 DIAGNOSIS — Z3480 Encounter for supervision of other normal pregnancy, unspecified trimester: Secondary | ICD-10-CM

## 2017-05-15 DIAGNOSIS — Z23 Encounter for immunization: Secondary | ICD-10-CM | POA: Diagnosis not present

## 2017-05-15 DIAGNOSIS — R1012 Left upper quadrant pain: Secondary | ICD-10-CM

## 2017-05-15 DIAGNOSIS — Z603 Acculturation difficulty: Secondary | ICD-10-CM

## 2017-05-15 NOTE — Progress Notes (Signed)
Patient here today for routine OB visit [redacted]w[redacted]d. Patient complains of pain on left side under her breast that comes and go.

## 2017-05-15 NOTE — Progress Notes (Signed)
   PRENATAL VISIT NOTE  Subjective:  Tara Chapman is a 21 y.o. G1P0 at [redacted]w[redacted]d being seen today for ongoing prenatal care.  She is currently monitored for the following issues for this low-risk pregnancy and has Supervision of other normal pregnancy, antepartum and Language barrier, cultural differences on her problem list.  Patient reports LUQ pain when working, agrivated .  Contractions: Not present. Vag. Bleeding: None.  Movement: Present. Denies leaking of fluid.   The following portions of the patient's history were reviewed and updated as appropriate: allergies, current medications, past family history, past medical history, past social history, past surgical history and problem list. Problem list updated.   Video interpreter used.   Objective:   Vitals:   05/15/17 0809  BP: 123/65  Pulse: (!) 102  Weight: 139 lb 3.2 oz (63.1 kg)    Fetal Status: Fetal Heart Rate (bpm): 151 Fundal Height: 29 cm Movement: Present  Presentation: Vertex  General:  Alert, oriented and cooperative. Patient is in no acute distress.  Skin: Skin is warm and dry. No rash noted.   Cardiovascular: Normal heart rate noted  Respiratory: Normal respiratory effort, no problems with respiration noted  Abdomen: Soft, gravid, appropriate for gestational age.  Pain/Pressure: Absent. NO LUQ TTP, mass.      Pelvic: Cervical exam deferred        Extremities: Normal range of motion.  Edema: None  Mental Status:  Normal mood and affect. Normal behavior. Normal judgment and thought content.   Assessment and Plan:  Pregnancy: G1P0 at [redacted]w[redacted]d  1. Supervision of other normal pregnancy, antepartum - Flu Vaccine.   2. Language barrier, cultural differences   3. Acute LUQ pain--likely MS. No C/W reflux. - Comfort measures - Work lifting restrictions note given.   Preterm labor symptoms and general obstetric precautions including but not limited to vaginal bleeding, contractions, leaking of fluid  and fetal movement were reviewed in detail with the patient. Please refer to After Visit Summary for other counseling recommendations.  Return in 2 weeks (on 05/29/2017).   Dorathy Kinsman, CNM

## 2017-05-15 NOTE — Patient Instructions (Addendum)
Pregnancy and Influenza Influenza, also called the flu, is an infection of the respiratory tract. If you are pregnant, you are more likely to catch the flu. You are also more likely to have a more serious case of the flu. This is because pregnancy lowers your body's ability to fight off infections (it weakens your immune system). It also puts additional stress on your heart and lungs, which makes you more likely to have complications. Having a bad case of the flu, especially with a high fever, can be dangerous for your developing baby. It can cause you to go into early labor. How do people get the flu? The flu is caused by the influenza virus. This virus is common every year in the fall and winter. It spreads when virus particles get passed from person to person. You can get the virus if you are near a sick person who is coughing or sneezing. You can also get the virus if you touch something that has the virus on it and then touch your face. How can I protect myself against the flu?  Get a flu shot. The best way to prevent the flu is to get a flu shot before flu season starts. The flu shot is not dangerous for your developing baby. It may even help protect your baby from the flu for up to 6 months after birth. The flu shot is one type of flu vaccine. Another type is a nasal spray vaccine. Do not get the nasal spray vaccine. It is not approved for pregnancy.  Do not come in close contact with sick people.  Do not share food, drinks, or utensils with other people.  Wash your hands often. Use hand sanitizer when soap and water are not available. What should I do if I have flu symptoms? If you have any flu symptoms, call your health care provider right away. Flu symptoms include:  Fever or chills.  Muscle aches.  Headache.  Sore throat.  Nasal congestion.  Cough.  Feeling tired.  Loss of appetite.  Vomiting.  Diarrhea.  You may be able to take an antiviral medicine to keep the flu  from becoming severe and to shorten how long it lasts. What should I do at home if I am diagnosed with the flu?  Do not take any medicine, including cold or flu medicine, unless directed by your health care provider.  If you take antiviral medicine, make sure you finish it even if you start to feel better.  Drink enough fluid to keep your urine clear or pale yellow.  Get plenty of rest. When would I seek immediate medical care if I have the flu?  You have trouble breathing.  You have chest pain.  You begin to have labor pains.  You have a high fever that does not go down after you take medicine.  You do not feel your baby move.  You have diarrhea or vomiting that will not go away. This information is not intended to replace advice given to you by your health care provider. Make sure you discuss any questions you have with your health care provider. Document Released: 06/08/2008 Document Revised: 01/12/2016 Document Reviewed: 07/03/2013 Elsevier Interactive Patient Education  2017 [employer].   Third Trimester of Pregnancy The third trimester is from week 28 through week 40 (months 7 through 9). The third trimester is a time when the unborn baby (fetus) is growing rapidly. At the end of the ninth month, the fetus is about 20 inches  in length and weighs 6-10 pounds. Body changes during your third trimester Your body will continue to go through many changes during pregnancy. The changes vary from woman to woman. During the third trimester:  Your weight will continue to increase. You can expect to gain 25-35 pounds (11-16 kg) by the end of the pregnancy.  You may begin to get stretch marks on your hips, abdomen, and breasts.  You may urinate more often because the fetus is moving lower into your pelvis and pressing on your bladder.  You may develop or continue to have heartburn. This is caused by increased hormones that slow down muscles in the digestive tract.  You may  develop or continue to have constipation because increased hormones slow digestion and cause the muscles that push waste through your intestines to relax.  You may develop hemorrhoids. These are swollen veins (varicose veins) in the rectum that can itch or be painful.  You may develop swollen, bulging veins (varicose veins) in your legs.  You may have increased body aches in the pelvis, back, or thighs. This is due to weight gain and increased hormones that are relaxing your joints.  You may have changes in your hair. These can include thickening of your hair, rapid growth, and changes in texture. Some women also have hair loss during or after pregnancy, or hair that feels dry or thin. Your hair will most likely return to normal after your baby is born.  Your breasts will continue to grow and they will continue to become tender. A yellow fluid (colostrum) may leak from your breasts. This is the first milk you are producing for your baby.  Your belly button may stick out.  You may notice more swelling in your hands, face, or ankles.  You may have increased tingling or numbness in your hands, arms, and legs. The skin on your belly may also feel numb.  You may feel short of breath because of your expanding uterus.  You may have more problems sleeping. This can be caused by the size of your belly, increased need to urinate, and an increase in your body's metabolism.  You may notice the fetus "dropping," or moving lower in your abdomen (lightening).  You may have increased vaginal discharge.  You may notice your joints feel loose and you may have pain around your pelvic bone.  What to expect at prenatal visits You will have prenatal exams every 2 weeks until week 36. Then you will have weekly prenatal exams. During a routine prenatal visit:  You will be weighed to make sure you and the baby are growing normally.  Your blood pressure will be taken.  Your abdomen will be measured to track  your baby's growth.  The fetal heartbeat will be listened to.  Any test results from the previous visit will be discussed.  You may have a cervical check near your due date to see if your cervix has softened or thinned (effaced).  You will be tested for Group B streptococcus. This happens between 35 and 37 weeks.  Your health care provider may ask you:  What your birth plan is.  How you are feeling.  If you are feeling the baby move.  If you have had any abnormal symptoms, such as leaking fluid, bleeding, severe headaches, or abdominal cramping.  If you are using any tobacco products, including cigarettes, chewing tobacco, and electronic cigarettes.  If you have any questions.  Other tests or screenings that may be performed during  your third trimester include:  Blood tests that check for low iron levels (anemia).  Fetal testing to check the health, activity level, and growth of the fetus. Testing is done if you have certain medical conditions or if there are problems during the pregnancy.  Nonstress test (NST). This test checks the health of your baby to make sure there are no signs of problems, such as the baby not getting enough oxygen. During this test, a belt is placed around your belly. The baby is made to move, and its heart rate is monitored during movement.  What is false labor? False labor is a condition in which you feel small, irregular tightenings of the muscles in the womb (contractions) that usually go away with rest, changing position, or drinking water. These are called Braxton Hicks contractions. Contractions may last for hours, days, or even weeks before true labor sets in. If contractions come at regular intervals, become more frequent, increase in intensity, or become painful, you should see your health care provider. What are the signs of labor?  Abdominal cramps.  Regular contractions that start at 10 minutes apart and become stronger and more frequent  with time.  Contractions that start on the top of the uterus and spread down to the lower abdomen and back.  Increased pelvic pressure and dull back pain.  A watery or bloody mucus discharge that comes from the vagina.  Leaking of amniotic fluid. This is also known as your "water breaking." It could be a slow trickle or a gush. Let your health care provider know if it has a color or strange odor. If you have any of these signs, call your health care provider right away, even if it is before your due date. Follow these instructions at home: Medicines  Follow your health care provider's instructions regarding medicine use. Specific medicines may be either safe or unsafe to take during pregnancy.  Take a prenatal vitamin that contains at least 600 micrograms (mcg) of folic acid.  If you develop constipation, try taking a stool softener if your health care provider approves. Eating and drinking  Eat a balanced diet that includes fresh fruits and vegetables, whole grains, good sources of protein such as meat, eggs, or tofu, and low-fat dairy. Your health care provider will help you determine the amount of weight gain that is right for you.  Avoid raw meat and uncooked cheese. These carry germs that can cause birth defects in the baby.  If you have low calcium intake from food, talk to your health care provider about whether you should take a daily calcium supplement.  Eat four or five small meals rather than three large meals a day.  Limit foods that are high in fat and processed sugars, such as fried and sweet foods.  To prevent constipation: ? Drink enough fluid to keep your urine clear or pale yellow. ? Eat foods that are high in fiber, such as fresh fruits and vegetables, whole grains, and beans. Activity  Exercise only as directed by your health care provider. Most women can continue their usual exercise routine during pregnancy. Try to exercise for 30 minutes at least 5 days a  week. Stop exercising if you experience uterine contractions.  Avoid heavy lifting.  Do not exercise in extreme heat or humidity, or at high altitudes.  Wear low-heel, comfortable shoes.  Practice good posture.  You may continue to have sex unless your health care provider tells you otherwise. Relieving pain and discomfort  Take  frequent breaks and rest with your legs elevated if you have leg cramps or low back pain.  Take warm sitz baths to soothe any pain or discomfort caused by hemorrhoids. Use hemorrhoid cream if your health care provider approves.  Wear a good support bra to prevent discomfort from breast tenderness.  If you develop varicose veins: ? Wear support pantyhose or compression stockings as told by your healthcare provider. ? Elevate your feet for 15 minutes, 3-4 times a day. Prenatal care  Write down your questions. Take them to your prenatal visits.  Keep all your prenatal visits as told by your health care provider. This is important. Safety  Wear your seat belt at all times when driving.  Make a list of emergency phone numbers, including numbers for family, friends, the hospital, and police and fire departments. General instructions  Avoid cat litter boxes and soil used by cats. These carry germs that can cause birth defects in the baby. If you have a cat, ask someone to clean the litter box for you.  Do not travel far distances unless it is absolutely necessary and only with the approval of your health care provider.  Do not use hot tubs, steam rooms, or saunas.  Do not drink alcohol.  Do not use any products that contain nicotine or tobacco, such as cigarettes and e-cigarettes. If you need help quitting, ask your health care provider.  Do not use any medicinal herbs or unprescribed drugs. These chemicals affect the formation and growth of the baby.  Do not douche or use tampons or scented sanitary pads.  Do not cross your legs for long periods of  time.  To prepare for the arrival of your baby: ? Take prenatal classes to understand, practice, and ask questions about labor and delivery. ? Make a trial run to the hospital. ? Visit the hospital and tour the maternity area. ? Arrange for maternity or paternity leave through employers. ? Arrange for family and friends to take care of pets while you are in the hospital. ? Purchase a rear-facing car seat and make sure you know how to install it in your car. ? Pack your hospital bag. ? Prepare the baby's nursery. Make sure to remove all pillows and stuffed animals from the baby's crib to prevent suffocation.  Visit your dentist if you have not gone during your pregnancy. Use a soft toothbrush to brush your teeth and be gentle when you floss. Contact a health care provider if:  You are unsure if you are in labor or if your water has broken.  You become dizzy.  You have mild pelvic cramps, pelvic pressure, or nagging pain in your abdominal area.  You have lower back pain.  You have persistent nausea, vomiting, or diarrhea.  You have an unusual or bad smelling vaginal discharge.  You have pain when you urinate. Get help right away if:  Your water breaks before 37 weeks.  You have regular contractions less than 5 minutes apart before 37 weeks.  You have a fever.  You are leaking fluid from your vagina.  You have spotting or bleeding from your vagina.  You have severe abdominal pain or cramping.  You have rapid weight loss or weight gain.  You have shortness of breath with chest pain.  You notice sudden or extreme swelling of your face, hands, ankles, feet, or legs.  Your baby makes fewer than 10 movements in 2 hours.  You have severe headaches that do not go away  when you take medicine.  You have vision changes. Summary  The third trimester is from week 28 through week 40, months 7 through 9. The third trimester is a time when the unborn baby (fetus) is growing  rapidly.  During the third trimester, your discomfort may increase as you and your baby continue to gain weight. You may have abdominal, leg, and back pain, sleeping problems, and an increased need to urinate.  During the third trimester your breasts will keep growing and they will continue to become tender. A yellow fluid (colostrum) may leak from your breasts. This is the first milk you are producing for your baby.  False labor is a condition in which you feel small, irregular tightenings of the muscles in the womb (contractions) that eventually go away. These are called Braxton Hicks contractions. Contractions may last for hours, days, or even weeks before true labor sets in.  Signs of labor can include: abdominal cramps; regular contractions that start at 10 minutes apart and become stronger and more frequent with time; watery or bloody mucus discharge that comes from the vagina; increased pelvic pressure and dull back pain; and leaking of amniotic fluid. This information is not intended to replace advice given to you by your health care provider. Make sure you discuss any questions you have with your health care provider. Document Released: 07/31/2001 Document Revised: 01/12/2016 Document Reviewed: 10/07/2012 Elsevier Interactive Patient Education  2017 [employer].

## 2017-05-29 ENCOUNTER — Ambulatory Visit (INDEPENDENT_AMBULATORY_CARE_PROVIDER_SITE_OTHER): Payer: Medicaid Other | Admitting: Advanced Practice Midwife

## 2017-05-29 ENCOUNTER — Encounter: Payer: Self-pay | Admitting: Medical

## 2017-05-29 ENCOUNTER — Encounter: Payer: Self-pay | Admitting: Advanced Practice Midwife

## 2017-05-29 VITALS — BP 134/76 | HR 92 | Wt 139.9 lb

## 2017-05-29 DIAGNOSIS — R1013 Epigastric pain: Secondary | ICD-10-CM

## 2017-05-29 DIAGNOSIS — Z3483 Encounter for supervision of other normal pregnancy, third trimester: Secondary | ICD-10-CM

## 2017-05-29 DIAGNOSIS — Z3480 Encounter for supervision of other normal pregnancy, unspecified trimester: Secondary | ICD-10-CM

## 2017-05-29 MED ORDER — FAMOTIDINE 20 MG PO TABS: 20.0000 mg | ORAL_TABLET | Freq: Two times a day (BID) | ORAL | 3 refills | Status: DC

## 2017-05-29 NOTE — Progress Notes (Signed)
   PRENATAL VISIT NOTE  Subjective:  Tara Chapman is a 21 y.o. G1P0 at [redacted]w[redacted]d being seen today for ongoing prenatal care.  She is currently monitored for the following issues for this low-risk pregnancy and has Supervision of other normal pregnancy, antepartum and Language barrier, cultural differences on her problem list.  Patient reports still having upper abd pain, LUQ>RUQ, now it is worse after eating. Also having some increased nausea, but no vomiting. Also C/O mild, but increased SOB and occasional non-productive cough. Denies fever, chills, chest pain.   Contractions: Irritability. Vag. Bleeding: None.  Movement: Present. Denies leaking of fluid.   The following portions of the patient's history were reviewed and updated as appropriate: allergies, current medications, past family history, past medical history, past social history, past surgical history and problem list. Problem list updated.  Objective:   Vitals:   05/29/17 0810  BP: 134/76  Pulse: 92  Weight: 139 lb 14.4 oz (63.5 kg)    Fetal Status: Fetal Heart Rate (bpm): 148 Fundal Height: 31 cm Movement: Present     General:  Alert, oriented and cooperative. Patient is in no acute distress.  Skin: Skin is warm and dry. No rash noted.   Cardiovascular: Normal heart rate noted. RRR. No M/R/G  Respiratory: Normal respiratory effort, no problems with respiration noted. CTAB.   Abdomen: Soft, gravid, appropriate for gestational age.  Pain/Pressure: Present  (Upper abd). NT. No mass   Pelvic: Cervical exam deferred        Extremities: Normal range of motion.  Edema: None  Mental Status:  Normal mood and affect. Normal behavior. Normal judgment and thought content.   Assessment and Plan:  Pregnancy: G1P0 at [redacted]w[redacted]d  1. Supervision of other normal pregnancy, antepartum   2. Epigastric pain  - Comprehensive metabolic panel - Amylase - Lipase - famotidine (PEPCID) 20 MG tablet; Take 1 tablet (20 mg total) by  mouth 2 (two) times daily.  Dispense: 60 tablet; Refill: 3 - Declines antiemetic.   3. SOB - Nml exam. Suspect Nml for third trimester. Precautions Reviewed.   Preterm labor symptoms and general obstetric precautions including but not limited to vaginal bleeding, contractions, leaking of fluid and fetal movement were reviewed in detail with the patient. Please refer to After Visit Summary for other counseling recommendations.  Return in about 2 weeks (around 06/12/2017) for ROB.   Dorathy Kinsman, CNM

## 2017-05-29 NOTE — Progress Notes (Signed)
Arabic interpreter Adil#[id number] used for visit Pt c/o upper abd pain and sob

## 2017-05-29 NOTE — Patient Instructions (Addendum)
[facility]  Address: [address] [place], [place] [address]  Hours:  Phone: [phone]   Heartburn During Pregnancy Heartburn is pain or discomfort in the throat or chest. It may cause a burning feeling. It happens when stomach acid moves up into the tube that carries food from your mouth to your stomach (esophagus). Heartburn is common during pregnancy. It usually goes away or gets better after giving birth. Follow these instructions at home: Eating and drinking  Do not drink alcohol while you are pregnant.  Figure out which foods and beverages make you feel worse, and avoid them.  Beverages that you may want to avoid include: ? Coffee and tea (with or without caffeine). ? Energy drinks and sports drinks. ? Bubbly (carbonated) drinks or sodas. ? Citrus fruit juices.  Foods that you may want to avoid include: ? Chocolate and cocoa. ? Peppermint and mint flavorings. ? Garlic, onions, and horseradish. ? Spicy and acidic foods. These include peppers, chili powder, curry powder, vinegar, hot sauces, and barbecue sauce. ? Citrus fruits, such as oranges, lemons, and limes. ? Tomato-based foods, such as red sauce, chili, and salsa. ? Fried and fatty foods, such as donuts, french fries, potato chips, and high-fat dressings. ? High-fat meats, such as hot dogs, cold cuts, sausage, ham, and bacon. ? High-fat dairy items, such as whole milk, butter, and cheese.  Eat small meals often, instead of large meals.  Avoid drinking a lot of liquid with your meals.  Avoid eating meals during the 2-3 hours before you go to bed.  Avoid lying down right after you eat.  Do not exercise right after you eat. Medicines  Take over-the-counter and prescription medicines only as told by your doctor.  Do not take aspirin, ibuprofen, or other NSAIDs unless your doctor tells you to do that.  Your doctor may tell you to avoid medicines that have sodium bicarbonate in them. General  instructions  If told, raise the head of your bed about 6 inches (15 cm). You can do this by putting blocks under the legs. Sleeping with more pillows does not help with heartburn.  Do not use any products that contain nicotine or tobacco, such as cigarettes and e-cigarettes. If you need help quitting, ask your doctor.  Wear loose-fitting clothing.  Try to lower your stress, such as with yoga or meditation. If you need help, ask your doctor.  Stay at a healthy weight. If you are overweight, work with your doctor to safely lose weight.  Keep all follow-up visits as told by your doctor. This is important. Contact a doctor if:  You get new symptoms.  Your symptoms do not get better with treatment.  You have weight loss and you do not know why.  You have trouble swallowing.  You make loud sounds when you breathe (wheeze).  You have a cough that does not go away.  You have heartburn often for more than 2 weeks.  You feel sick to your stomach (nauseous), and this does not get better with treatment.  You are throwing up (vomiting), and this does not get better with treatment.  You have pain in your belly (abdomen). Get help right away if:  You have very bad chest pain that spreads to your arm, neck, or jaw.  You feel sweaty, dizzy, or light-headed.  You have trouble breathing.  You have pain when swallowing.  You throw up and your throw-up looks like blood or coffee grounds.  Your poop (stool)  is bloody or black. This information is not intended to replace advice given to you by your health care provider. Make sure you discuss any questions you have with your health care provider. Document Released: 09/08/2010 Document Revised: 04/23/2016 Document Reviewed: 04/23/2016 Elsevier Interactive Patient Education  2017 [employer].

## 2017-06-03 ENCOUNTER — Encounter (HOSPITAL_COMMUNITY): Payer: Self-pay

## 2017-06-03 ENCOUNTER — Inpatient Hospital Stay (HOSPITAL_COMMUNITY)
Admission: AD | Admit: 2017-06-03 | Discharge: 2017-06-03 | Disposition: A | Discharge status: Home / Self Care | Payer: Medicaid Other | Source: Ambulatory Visit | Attending: Family Medicine | Admitting: Family Medicine

## 2017-06-03 DIAGNOSIS — O26893 Other specified pregnancy related conditions, third trimester: Secondary | ICD-10-CM | POA: Diagnosis not present

## 2017-06-03 DIAGNOSIS — J069 Acute upper respiratory infection, unspecified: Secondary | ICD-10-CM | POA: Diagnosis not present

## 2017-06-03 DIAGNOSIS — Z3A32 32 weeks gestation of pregnancy: Secondary | ICD-10-CM | POA: Diagnosis not present

## 2017-06-03 DIAGNOSIS — O99513 Diseases of the respiratory system complicating pregnancy, third trimester: Secondary | ICD-10-CM | POA: Diagnosis not present

## 2017-06-03 DIAGNOSIS — R51 Headache: Secondary | ICD-10-CM

## 2017-06-03 DIAGNOSIS — R05 Cough: Secondary | ICD-10-CM | POA: Diagnosis present

## 2017-06-03 DIAGNOSIS — R509 Fever, unspecified: Secondary | ICD-10-CM | POA: Diagnosis not present

## 2017-06-03 LAB — URINALYSIS, ROUTINE W REFLEX MICROSCOPIC
BILIRUBIN URINE: NEGATIVE
GLUCOSE, UA: NEGATIVE mg/dL
HGB URINE DIPSTICK: NEGATIVE
KETONES UR: NEGATIVE mg/dL
Leukocytes, UA: NEGATIVE
Nitrite: NEGATIVE
PROTEIN: NEGATIVE mg/dL
Specific Gravity, Urine: 1.006 (ref 1.005–1.030)
pH: 6 (ref 5.0–8.0)

## 2017-06-03 LAB — INFLUENZA PANEL BY PCR (TYPE A & B)
Influenza A By PCR: NEGATIVE
Influenza B By PCR: NEGATIVE

## 2017-06-03 MED ORDER — ACETAMINOPHEN 500 MG PO TABS
1000.0000 mg | ORAL_TABLET | Freq: Once | ORAL | Status: AC
  Administered 2017-06-03: 1000 mg via ORAL
  Filled 2017-06-03: qty 2

## 2017-06-03 MED ORDER — BENZONATATE 100 MG PO CAPS
200.0000 mg | ORAL_CAPSULE | Freq: Once | ORAL | Status: AC
  Administered 2017-06-03: 200 mg via ORAL
  Filled 2017-06-03: qty 2

## 2017-06-03 MED ORDER — LACTATED RINGERS IV BOLUS (SEPSIS)
1000.0000 mL | Freq: Once | INTRAVENOUS | Status: AC
  Administered 2017-06-03: 1000 mL via INTRAVENOUS

## 2017-06-03 MED ORDER — GUAIFENESIN ER 600 MG PO TB12: 600.0000 mg | ORAL_TABLET | Freq: Two times a day (BID) | ORAL | 0 refills | Status: DC | PRN

## 2017-06-03 MED ORDER — ACETAMINOPHEN 500 MG PO TABS: 1000.0000 mg | ORAL_TABLET | Freq: Four times a day (QID) | ORAL | 0 refills | Status: DC | PRN

## 2017-06-03 MED ORDER — BENZONATATE 100 MG PO CAPS: 100.0000 mg | ORAL_CAPSULE | Freq: Three times a day (TID) | ORAL | 0 refills | Status: DC | PRN

## 2017-06-03 MED ORDER — GUAIFENESIN ER 600 MG PO TB12
1200.0000 mg | ORAL_TABLET | Freq: Once | ORAL | Status: AC
  Administered 2017-06-03: 1200 mg via ORAL
  Filled 2017-06-03: qty 2

## 2017-06-03 NOTE — MAU Provider Note (Signed)
Chief Complaint:  URI; Chills; and Fatigue   None     HPI: Tara Chapman is a 21 y.o. G1P0 at [redacted]w[redacted]d who presents to maternity admissions reporting cough, congestion, fever/chills, h/a and body aches x 2 days. She has not tried any treatments because she is not sure what she can take in pregnancy.  She denies sick contacts.  There are no obstetric symptoms or any other associated symptoms. She reports good fetal movement, denies cramping, LOF, vaginal bleeding, vaginal itching/burning, or urinary symptoms  HPI  Past Medical History: History reviewed. No pertinent past medical history.  Past obstetric history: OB History  Gravida Para Term Preterm AB Living  1            SAB TAB Ectopic Multiple Live Births               # Outcome Date GA Lbr Len/2nd Weight Sex Delivery Anes PTL Lv  1 Current               Past Surgical History: History reviewed. No pertinent surgical history.  Family History: History reviewed. No pertinent family history.  Social History: Social History  Substance Use Topics  . Smoking status: Never Smoker  . Smokeless tobacco: Never Used  . Alcohol use No    Allergies: No Known Allergies  Meds:  Prescriptions Prior to Admission  Medication Sig Dispense Refill Last Dose  . Prenatal Multivit-Min-Fe-FA (PRENATAL VITAMINS) 0.8 MG tablet Take 1 tablet by mouth daily. 30 tablet 12 06/02/2017 at Unknown time  . famotidine (PEPCID) 20 MG tablet Take 1 tablet (20 mg total) by mouth 2 (two) times daily. 60 tablet 3 More than a month at Unknown time  . hydrOXYzine (VISTARIL) 25 MG capsule Take 1 capsule (25 mg total) by mouth 3 (three) times daily as needed. (Patient not taking: Reported on 01/30/2017) 30 capsule 0 More than a month at Unknown time  . promethazine (PHENERGAN) 12.5 MG tablet Take 1 tablet (12.5 mg total) by mouth every 6 (six) hours as needed for nausea or vomiting. (Patient not taking: Reported on 03/27/2017) 30 tablet 0 More than a  month at Unknown time    ROS:  Review of Systems  Constitutional: Positive for chills and fever. Negative for fatigue.  HENT: Positive for congestion and rhinorrhea.   Eyes: Negative for visual disturbance.  Respiratory: Positive for cough. Negative for shortness of breath.   Cardiovascular: Negative for chest pain.  Gastrointestinal: Negative for abdominal pain, nausea and vomiting.  Genitourinary: Negative for difficulty urinating, dysuria, flank pain, pelvic pain, vaginal bleeding, vaginal discharge and vaginal pain.  Musculoskeletal: Positive for myalgias.  Neurological: Positive for weakness and headaches. Negative for dizziness.  Psychiatric/Behavioral: Negative.      I have reviewed patient's Past Medical Hx, Surgical Hx, Family Hx, Social Hx, medications and allergies.   Physical Exam   Patient Vitals for the past 24 hrs:  BP Temp Temp src Pulse Resp SpO2 Weight  06/03/17 2130 - 98.7 F (37.1 C) Oral - - - -  06/03/17 1853 122/62 98.4 F (36.9 C) Oral 97 18 100 % 145 lb (65.8 kg)   Constitutional: Well-developed, well-nourished female in moderate distress.  Cardiovascular: normal rate Respiratory: normal effort GI: Abd soft, non-tender, gravid appropriate for gestational age.  MS: Extremities nontender, no edema, normal ROM Neurologic: Alert and oriented x 4.  GU: Neg CVAT.    FHT:  Baseline 150 , moderate variability, accelerations present, no decelerations Contractions: occasional, mild  to palpation   Labs: Results for orders placed or performed during the hospital encounter of 06/03/17 (from the past 24 hour(s))  Influenza panel by PCR (type A & B)     Status: None   Collection Time: 06/03/17  7:45 PM  Result Value Ref Range   Influenza A By PCR NEGATIVE NEGATIVE   Influenza B By PCR NEGATIVE NEGATIVE  Urinalysis, Routine w reflex microscopic     Status: Abnormal   Collection Time: 06/03/17  8:45 PM  Result Value Ref Range   Color, Urine YELLOW YELLOW    APPearance HAZY (A) CLEAR   Specific Gravity, Urine 1.006 1.005 - 1.030   pH 6.0 5.0 - 8.0   Glucose, UA NEGATIVE NEGATIVE mg/dL   Hgb urine dipstick NEGATIVE NEGATIVE   Bilirubin Urine NEGATIVE NEGATIVE   Ketones, ur NEGATIVE NEGATIVE mg/dL   Protein, ur NEGATIVE NEGATIVE mg/dL   Nitrite NEGATIVE NEGATIVE   Leukocytes, UA NEGATIVE NEGATIVE   B/Positive/-- (06/13 1105)  Imaging:  No results found.  MAU Course/MDM: I have ordered labs and reviewed results.  NST reviewed and reactive Influenza negative LR x 1000 ml, Tylenol 1000 mg PO, Mucinex 1200 mg PO, Tessalon Perles 200 mg given in MAU Pt reports feeling much better after fluids/meds Likely viral URI, treat with Tylenol, Mucinex, and Tessalon Perles, list of safe OTC meds given F/U in [facility] office as scheduled Return to MAU as needed for emergencies  Pt stable at time of discharge.   Assessment: 1. Viral upper respiratory infection   2. Headache in pregnancy, antepartum, third trimester   3. Fever and chills     Plan: Discharge home Labor precautions and fetal kick counts Follow-up Information    [facility].   Specialty:  Obstetrics and Gynecology Why:  As scheduled, return to MAU as needed for emergencies. Contact information: [address] [place] [address] [phone]         Allergies as of 06/03/2017   No Known Allergies     Medication List    STOP taking these medications   hydrOXYzine 25 MG capsule Commonly known as:  VISTARIL   promethazine 12.5 MG tablet Commonly known as:  PHENERGAN     TAKE these medications   acetaminophen 500 MG tablet Commonly known as:  TYLENOL Take 2 tablets (1,000 mg total) by mouth every 6 (six) hours as needed.   benzonatate 100 MG capsule Commonly known as:  TESSALON PERLES Take 1 capsule (100 mg total) by mouth 3 (three) times daily as needed for cough.   famotidine 20 MG tablet Commonly known as:   PEPCID Take 1 tablet (20 mg total) by mouth 2 (two) times daily.   guaiFENesin 600 MG 12 hr tablet Commonly known as:  MUCINEX Take 1-2 tablets (600-1,200 mg total) by mouth 2 (two) times daily as needed.   Prenatal Vitamins 0.8 MG tablet Take 1 tablet by mouth daily.       Sharen Counter Certified Nurse-Midwife 06/03/2017 9:49 PM

## 2017-06-03 NOTE — MAU Note (Signed)
Cold x 2 days Cough--productive yellow mucous  +fever and chills +weak/feels shaky  States unsure what she can take.

## 2017-06-12 ENCOUNTER — Encounter: Payer: Self-pay | Admitting: Obstetrics & Gynecology

## 2017-06-12 ENCOUNTER — Ambulatory Visit (INDEPENDENT_AMBULATORY_CARE_PROVIDER_SITE_OTHER): Payer: Medicaid Other | Admitting: Advanced Practice Midwife

## 2017-06-12 VITALS — BP 115/72 | HR 88 | Temp 98.1°F | Wt 144.2 lb

## 2017-06-12 DIAGNOSIS — R059 Cough, unspecified: Secondary | ICD-10-CM

## 2017-06-12 DIAGNOSIS — R05 Cough: Secondary | ICD-10-CM

## 2017-06-12 DIAGNOSIS — Z3483 Encounter for supervision of other normal pregnancy, third trimester: Secondary | ICD-10-CM

## 2017-06-12 DIAGNOSIS — Z3480 Encounter for supervision of other normal pregnancy, unspecified trimester: Secondary | ICD-10-CM

## 2017-06-12 MED ORDER — BENZONATATE 100 MG PO CAPS: 100.0000 mg | ORAL_CAPSULE | Freq: Three times a day (TID) | ORAL | 0 refills | Status: DC | PRN

## 2017-06-12 MED ORDER — GUAIFENESIN ER 600 MG PO TB12: 1200.0000 mg | ORAL_TABLET | Freq: Two times a day (BID) | ORAL | 0 refills | Status: DC | PRN

## 2017-06-12 NOTE — Patient Instructions (Signed)
Viral Illness, Adult Viruses are tiny germs that can get into a person's body and cause illness. There are many different types of viruses, and they cause many types of illness. Viral illnesses can range from mild to severe. They can affect various parts of the body. Common illnesses that are caused by a virus include colds and the flu. Viral illnesses also include serious conditions such as HIV/AIDS (human immunodeficiency virus/acquired immunodeficiency syndrome). A few viruses have been linked to certain cancers. What are the causes? Many types of viruses can cause illness. Viruses invade cells in your body, multiply, and cause the infected cells to malfunction or die. When the cell dies, it releases more of the virus. When this happens, you develop symptoms of the illness, and the virus continues to spread to other cells. If the virus takes over the function of the cell, it can cause the cell to divide and grow out of control, as is the case when a virus causes cancer. Different viruses get into the body in different ways. You can get a virus by:  Swallowing food or water that is contaminated with the virus.  Breathing in droplets that have been coughed or sneezed into the air by an infected person.  Touching a surface that has been contaminated with the virus and then touching your eyes, nose, or mouth.  Being bitten by an insect or animal that carries the virus.  Having sexual contact with a person who is infected with the virus.  Being exposed to blood or fluids that contain the virus, either through an open cut or during a transfusion.  If a virus enters your body, your body's defense system (immune system) will try to fight the virus. You may be at higher risk for a viral illness if your immune system is weak. What are the signs or symptoms? Symptoms vary depending on the type of virus and the location of the cells that it invades. Common symptoms of the main types of viral illnesses  include: Cold and flu viruses  Fever.  Headache.  Sore throat.  Muscle aches.  Nasal congestion.  Cough. Digestive system (gastrointestinal) viruses  Fever.  Abdominal pain.  Nausea.  Diarrhea. Liver viruses (hepatitis)  Loss of appetite.  Tiredness.  Yellowing of the skin (jaundice). Brain and spinal cord viruses  Fever.  Headache.  Stiff neck.  Nausea and vomiting.  Confusion or sleepiness. Skin viruses  Warts.  Itching.  Rash. Sexually transmitted viruses  Discharge.  Swelling.  Redness.  Rash. How is this treated? Viruses can be difficult to treat because they live within cells. Antibiotic medicines do not treat viruses because these drugs do not get inside cells. Treatment for a viral illness may include:  Resting and drinking plenty of fluids.  Medicines to relieve symptoms. These can include over-the-counter medicine for pain and fever, medicines for cough or congestion, and medicines to relieve diarrhea.  Antiviral medicines. These drugs are available only for certain types of viruses. They may help reduce flu symptoms if taken early. There are also many antiviral medicines for hepatitis and HIV/AIDS.  Some viral illnesses can be prevented with vaccinations. A common example is the flu shot. Follow these instructions at home: Medicines   Take over-the-counter and prescription medicines only as told by your health care provider.  If you were prescribed an antiviral medicine, take it as told by your health care provider. Do not stop taking the medicine even if you start to feel better.  Be aware   of when antibiotics are needed and when they are not needed. Antibiotics do not treat viruses. If your health care provider thinks that you may have a bacterial infection as well as a viral infection, you may get an antibiotic. ? Do not ask for an antibiotic prescription if you have been diagnosed with a viral illness. That will not make your  illness go away faster. ? Frequently taking antibiotics when they are not needed can lead to antibiotic resistance. When this develops, the medicine no longer works against the bacteria that it normally fights. General instructions  Drink enough fluids to keep your urine clear or pale yellow.  Rest as much as possible.  Return to your normal activities as told by your health care provider. Ask your health care provider what activities are safe for you.  Keep all follow-up visits as told by your health care provider. This is important. How is this prevented? Take these actions to reduce your risk of viral infection:  Eat a healthy diet and get enough rest.  Wash your hands often with soap and water. This is especially important when you are in public places. If soap and water are not available, use hand sanitizer.  Avoid close contact with friends and family who have a viral illness.  If you travel to areas where viral gastrointestinal infection is common, avoid drinking water or eating raw food.  Keep your immunizations up to date. Get a flu shot every year as told by your health care provider.  Do not share toothbrushes, nail clippers, razors, or needles with other people.  Always practice safe sex.  Contact a health care provider if:  You have symptoms of a viral illness that do not go away.  Your symptoms come back after going away.  Your symptoms get worse. Get help right away if:  You have trouble breathing.  You have a severe headache or a stiff neck.  You have severe vomiting or abdominal pain. This information is not intended to replace advice given to you by your health care provider. Make sure you discuss any questions you have with your health care provider. Document Released: 12/16/2015 Document Revised: 01/18/2016 Document Reviewed: 12/16/2015 Elsevier Interactive Patient Education  2018 Elsevier Inc.  

## 2017-06-12 NOTE — Progress Notes (Signed)
Pt had flu last week, is still coughing, abdomen hurts with cough. Arabic video interpreter "Nael" [phone] used for visit

## 2017-06-12 NOTE — Progress Notes (Signed)
   PRENATAL VISIT NOTE  Subjective:  Tara Chapman is a 21 y.o. G1P0 at 234w5d being seen today for ongoing prenatal care.  She is currently monitored for the following issues for this low-risk pregnancy and has Supervision of other normal pregnancy, antepartum and Language barrier, cultural differences on her problem list.  Patient reports non-productive cough x 1 week. .  Contractions: Not present. Vag. Bleeding: None.  Movement: Present. Denies leaking of fluid.   The following portions of the patient's history were reviewed and updated as appropriate: allergies, current medications, past family history, past medical history, past social history, past surgical history and problem list. Problem list updated.  Objective:   Vitals:   06/12/17 0753  BP: 115/72  Pulse: 88  Temp: 98.1 F (36.7 C)  Weight: 144 lb 3.2 oz (65.4 kg)    Fetal Status: Fetal Heart Rate (bpm): 152 Fundal Height: 33 cm Movement: Present     General:  Alert, oriented and cooperative. Patient is in no acute distress.  Skin: Skin is warm and dry. No rash noted.   Cardiovascular: Normal heart rate noted  Respiratory: Normal respiratory effort, no problems with respiration noted  Abdomen: Soft, gravid, appropriate for gestational age.  Pain/Pressure: Present     Pelvic: Cervical exam deferred        Extremities: Normal range of motion.  Edema: None  Mental Status:  Normal mood and affect. Normal behavior. Normal judgment and thought content.   Assessment and Plan:  Pregnancy: G1P0 at 8434w5d  1. Cough  - benzonatate (TESSALON PERLES) 100 MG capsule; Take 1 capsule (100 mg total) by mouth 3 (three) times daily as needed for cough.  Dispense: 30 capsule; Refill: 0 - guaiFENesin (MUCINEX) 600 MG 12 hrtablet; Take 2 tablets (1,200 mg total) by mouth 2 (two) times daily as needed.  Dispense: 20 tablet; Refill: 0  2. Supervision of other normal pregnancy, antepartum  Preterm labor symptoms and  general obstetric precautions including but not limited to vaginal bleeding, contractions, leaking of fluid and fetal movement were reviewed in detail with the patient. Please refer to After Visit Summary for other counseling recommendations.  Return in about 2 weeks (around 06/26/2017) for ROB/GBS.   Dorathy KinsmanVirginia Bronsen Serano, CNM

## 2017-06-19 ENCOUNTER — Encounter: Payer: Medicaid Other | Admitting: Advanced Practice Midwife

## 2017-06-20 ENCOUNTER — Encounter: Payer: Self-pay | Admitting: Family Medicine

## 2017-06-20 ENCOUNTER — Ambulatory Visit (INDEPENDENT_AMBULATORY_CARE_PROVIDER_SITE_OTHER): Payer: Medicaid Other | Admitting: Student

## 2017-06-20 VITALS — BP 103/78 | HR 86 | Wt 147.0 lb

## 2017-06-20 DIAGNOSIS — R102 Pelvic and perineal pain: Secondary | ICD-10-CM

## 2017-06-20 DIAGNOSIS — Z3480 Encounter for supervision of other normal pregnancy, unspecified trimester: Secondary | ICD-10-CM

## 2017-06-20 DIAGNOSIS — O26899 Other specified pregnancy related conditions, unspecified trimester: Secondary | ICD-10-CM

## 2017-06-20 DIAGNOSIS — Z603 Acculturation difficulty: Secondary | ICD-10-CM

## 2017-06-20 LAB — GLUCOSE, CAPILLARY: Glucose-Capillary: 83 mg/dL (ref 65–99)

## 2017-06-20 NOTE — Progress Notes (Signed)
Pt stated having severe pain on the lower abdominal/back and not able to sleep.

## 2017-06-20 NOTE — Patient Instructions (Signed)

## 2017-06-20 NOTE — Progress Notes (Addendum)
   PRENATAL VISIT NOTE  Subjective:  Tara Chapman is a 21 y.o. G1P0 at 4971w6d being seen today for ongoing prenatal care.  She is currently monitored for the following issues for this low-risk pregnancy and has Supervision of other normal pregnancy, antepartum; Language barrier, cultural differences; and Pain of round ligament affecting pregnancy, antepartum on her problem list.  Patient reports round ligament pain and back pain that occurs at night. 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 .  Contractions: Not present. Vag. Bleeding: None.  Movement: Present. Denies leaking of fluid.   The following portions of the patient's history were reviewed and updated as appropriate: allergies, current medications, past family history, past medical history, past social history, past surgical history and problem list. Problem list updated.  Objective:   Vitals:   06/20/17 0810  BP: 103/78  Pulse: 86  Weight: 147 lb (66.7 kg)    Fetal Status: Fetal Heart Rate (bpm): 148 Fundal Height: 35 cm Movement: Present     General:  Alert, oriented and cooperative. Patient is in no acute distress.  Skin: Skin is warm and dry. No rash noted.   Cardiovascular: Normal heart rate noted  Respiratory: Normal respiratory effort, no problems with respiration noted  Abdomen: Soft, gravid, appropriate for gestational age.  Pain/Pressure: Present     Pelvic: Cervical exam deferred        Extremities: Normal range of motion.  Edema: None  Mental Status:  Normal mood and affect. Normal behavior. Normal judgment and thought content.   Assessment and Plan:  Pregnancy: G1P0 at 8471w6d  1. Pain of round ligament affecting pregnancy, antepartum Recommended stretches and exercises to relieve pain.   2. Language barrier, cultural differences Licensed interpreter present.   3. Supervision of other normal pregnancy, antepartum Reviewed normal changes in pregnancy (increased vaginal pressure,  occassioanl contractions, and when to visit the MAU.   CBG is 83 today  Preterm labor symptoms and general obstetric precautions including but not limited to vaginal bleeding, contractions, leaking of fluid and fetal movement were reviewed in detail with the patient. Please refer to After Visit Summary for other counseling recommendations.  Return in about 2 weeks (around 07/04/2017).   Marylene LandKathryn Lorraine Dafne Nield, CNM

## 2017-07-04 ENCOUNTER — Other Ambulatory Visit (HOSPITAL_COMMUNITY)
Admission: RE | Admit: 2017-07-04 | Discharge: 2017-07-04 | Disposition: A | Discharge status: Home / Self Care | Payer: Medicaid Other | Source: Ambulatory Visit | Attending: Student | Admitting: Student

## 2017-07-04 ENCOUNTER — Ambulatory Visit (INDEPENDENT_AMBULATORY_CARE_PROVIDER_SITE_OTHER): Payer: Medicaid Other | Admitting: Student

## 2017-07-04 ENCOUNTER — Encounter: Payer: Self-pay | Admitting: Advanced Practice Midwife

## 2017-07-04 VITALS — BP 111/78 | HR 102 | Wt 154.0 lb

## 2017-07-04 DIAGNOSIS — R109 Unspecified abdominal pain: Secondary | ICD-10-CM | POA: Diagnosis not present

## 2017-07-04 DIAGNOSIS — O26893 Other specified pregnancy related conditions, third trimester: Secondary | ICD-10-CM | POA: Insufficient documentation

## 2017-07-04 DIAGNOSIS — Z3483 Encounter for supervision of other normal pregnancy, third trimester: Secondary | ICD-10-CM | POA: Diagnosis not present

## 2017-07-04 DIAGNOSIS — Z3480 Encounter for supervision of other normal pregnancy, unspecified trimester: Secondary | ICD-10-CM

## 2017-07-04 LAB — POCT URINALYSIS DIP (DEVICE)
Bilirubin Urine: NEGATIVE
Glucose, UA: NEGATIVE mg/dL
KETONES UR: NEGATIVE mg/dL
Nitrite: NEGATIVE
PH: 6 (ref 5.0–8.0)
PROTEIN: NEGATIVE mg/dL
SPECIFIC GRAVITY, URINE: 1.025 (ref 1.005–1.030)
UROBILINOGEN UA: 0.2 mg/dL (ref 0.0–1.0)

## 2017-07-04 LAB — OB RESULTS CONSOLE GBS: GBS: NEGATIVE

## 2017-07-04 LAB — OB RESULTS CONSOLE GC/CHLAMYDIA: Gonorrhea: NEGATIVE

## 2017-07-04 NOTE — Patient Instructions (Signed)

## 2017-07-05 ENCOUNTER — Other Ambulatory Visit: Payer: Self-pay | Admitting: Student

## 2017-07-05 LAB — CERVICOVAGINAL ANCILLARY ONLY
BACTERIAL VAGINITIS: NEGATIVE
Candida vaginitis: POSITIVE — AB
Chlamydia: NEGATIVE
NEISSERIA GONORRHEA: NEGATIVE

## 2017-07-05 MED ORDER — TERCONAZOLE 0.4 % VA CREA: 1.0000 | TOPICAL_CREAM | Freq: Every day | VAGINAL | 0 refills | Status: DC

## 2017-07-05 NOTE — Progress Notes (Signed)
   PRENATAL VISIT NOTE  Subjective:  Karra Abulnasir Caleen JobsBabiker Moorer is a [age] y.o. G1P0 at 1454w0d being seen today for ongoing prenatal care.  She is currently monitored for the following issues for this low-risk pregnancy and has Supervision of other normal pregnancy, antepartum; Language barrier, cultural differences; and Pain of round ligament affecting pregnancy, antepartum on their problem list.  Patient reports abdominal cramping and pressure. Denies dysuria, back ache, fever or unusual vagina discharge. .  Contractions: Irritability. Vag. Bleeding: None.  Movement: Present. Denies leaking of fluid.   The following portions of the patient's history were reviewed and updated as appropriate: allergies, current medications, past family history, past medical history, past social history, past surgical history and problem list. Problem list updated.  Objective:   Vitals:   07/04/17 1122  BP: 111/78  Pulse: (!) 102  Weight: 154 lb (69.9 kg)    Fetal Status: Fetal Heart Rate (bpm): 154 Fundal Height: 36 cm Movement: Present  Presentation: Vertex  General:  Alert, oriented and cooperative. Patient is in no acute distress.  Skin: Skin is warm and dry. No rash noted.   Cardiovascular: Normal heart rate noted  Respiratory: Normal respiratory effort, no problems with respiration noted  Abdomen: Soft, gravid, appropriate for gestational age.  Pain/Pressure: Present     Pelvic: Cervical exam performed Dilation: Closed      Extremities: Normal range of motion.  Edema: Trace  Mental Status:  Normal mood and affect. Normal behavior. Normal judgment and thought content.   Assessment and Plan:  Pregnancy: G1P0 at 7454w0d  1. Abdominal pain in pregnancy, third trimester Reassured patient that it is normal to feel pain and pressure, but will do Urine culture to double check that there is no sign of UTI. Patient has had many complaints about round ligament pain and discomfort during pregnancy; I have  recommended maternity belt and stretching, yoga as comfort measures in the past.  - Culture, OB Urine  - Cervicovaginal ancillary only Plus BV and yeast prep. 2. Supervision of other normal pregnancy, antepartum - Culture, beta strep (group b only)  Term labor symptoms and general obstetric precautions including but not limited to vaginal bleeding, contractions, leaking of fluid and fetal movement were reviewed in detail with the patient. Please refer to After Visit Summary for other counseling recommendations.  Return in about 1 week (around 07/11/2017), or ROB.   Marylene LandKathryn Lorraine Kooistra, CNM

## 2017-07-06 LAB — CULTURE, OB URINE

## 2017-07-06 LAB — URINE CULTURE, OB REFLEX

## 2017-07-07 LAB — CULTURE, BETA STREP (GROUP B ONLY): STREP GP B CULTURE: POSITIVE — AB

## 2017-07-10 ENCOUNTER — Encounter: Payer: Self-pay | Admitting: Student

## 2017-07-10 DIAGNOSIS — B951 Streptococcus, group B, as the cause of diseases classified elsewhere: Secondary | ICD-10-CM | POA: Insufficient documentation

## 2017-07-18 ENCOUNTER — Ambulatory Visit (INDEPENDENT_AMBULATORY_CARE_PROVIDER_SITE_OTHER): Payer: Medicaid Other | Admitting: Student

## 2017-07-18 VITALS — BP 116/67 | HR 97 | Wt 154.0 lb

## 2017-07-18 DIAGNOSIS — R309 Painful micturition, unspecified: Secondary | ICD-10-CM

## 2017-07-18 DIAGNOSIS — Z3493 Encounter for supervision of normal pregnancy, unspecified, third trimester: Secondary | ICD-10-CM

## 2017-07-18 NOTE — Progress Notes (Signed)
Pt stated abdominal pain/cramp since last OV and especially when urinating is painful. Pt having contraction 5 minute apart.

## 2017-07-19 NOTE — Patient Instructions (Signed)

## 2017-07-19 NOTE — Progress Notes (Signed)
   PRENATAL VISIT NOTE  Subjective:  Tara Chapman is a 21 y.o. G1P0 at 4052w0d being seen today for ongoing prenatal care.  She is currently monitored for the following issues for this low-risk pregnancy and has Supervision of other normal pregnancy, antepartum; Language barrier, cultural differences; Pain of round ligament affecting pregnancy, antepartum; and Positive GBS test on their problem list.  Patient reports some pain when she urinates. Isn't sure if its because of pressure or something else. Denies odor, low back pain. .  Contractions: Irritability. Vag. Bleeding: None.  Movement: Present. Denies leaking of fluid.   The following portions of the patient's history were reviewed and updated as appropriate: allergies, current medications, past family history, past medical history, past social history, past surgical history and problem list. Problem list updated.  Objective:   Vitals:   07/18/17 1522  BP: 116/67  Pulse: 97  Weight: 154 lb (69.9 kg)    Fetal Status: Fetal Heart Rate (bpm): 154 Fundal Height: 38 cm Movement: Present     General:  Alert, oriented and cooperative. Patient is in no acute distress.  Skin: Skin is warm and dry. No rash noted.   Cardiovascular: Normal heart rate noted  Respiratory: Normal respiratory effort, no problems with respiration noted  Abdomen: Soft, gravid, appropriate for gestational age.  Pain/Pressure: Present     Pelvic: Cervical exam deferred        Extremities: Normal range of motion.  Edema: Trace  Mental Status:  Normal mood and affect. Normal behavior. Normal judgment and thought content.   Assessment and Plan:  Pregnancy: G1P0 at 9752w0d  1. Painful urination Otherwise doing well; will send for culture as precaution.  - Culture, OB Urine  Term labor symptoms and general obstetric precautions including but not limited to vaginal bleeding, contractions, leaking of fluid and fetal movement were reviewed in detail with  the patient. Please refer to After Visit Summary for other counseling recommendations.  Return in about 1 week (around 07/25/2017).  Arabic [place] interpreter used during visit.   Marylene LandKathryn Lorraine Jessieca Rhem, CNM

## 2017-07-20 LAB — URINE CULTURE, OB REFLEX

## 2017-07-20 LAB — CULTURE, OB URINE

## 2017-07-21 ENCOUNTER — Inpatient Hospital Stay (HOSPITAL_COMMUNITY)
Admission: AD | Admit: 2017-07-21 | Discharge: 2017-07-21 | Disposition: A | Discharge status: Home / Self Care | Payer: Medicaid Other | Source: Ambulatory Visit | Attending: Obstetrics & Gynecology | Admitting: Obstetrics & Gynecology

## 2017-07-21 ENCOUNTER — Other Ambulatory Visit: Payer: Self-pay

## 2017-07-21 ENCOUNTER — Encounter (HOSPITAL_COMMUNITY): Payer: Self-pay

## 2017-07-21 ENCOUNTER — Other Ambulatory Visit: Payer: Self-pay | Admitting: Obstetrics and Gynecology

## 2017-07-21 DIAGNOSIS — Z3490 Encounter for supervision of normal pregnancy, unspecified, unspecified trimester: Secondary | ICD-10-CM | POA: Diagnosis present

## 2017-07-21 DIAGNOSIS — Z3A39 39 weeks gestation of pregnancy: Secondary | ICD-10-CM | POA: Diagnosis not present

## 2017-07-21 DIAGNOSIS — O471 False labor at or after 37 completed weeks of gestation: Secondary | ICD-10-CM

## 2017-07-21 DIAGNOSIS — Z3493 Encounter for supervision of normal pregnancy, unspecified, third trimester: Secondary | ICD-10-CM | POA: Insufficient documentation

## 2017-07-21 NOTE — Progress Notes (Addendum)
G1 @ 39.[redacted] wksga. Presents to triage for r/o labor with ctx that started last night and stronger today. Possible LOF or bleeding. + fm. efm applied. VSS see flow sheet for details.   GBS+  Stratus used : Dalia   SVE: 3.5/50/-3  Fern test: negative  1630: provider notified. Report status of pt given. Orders received to discharge pt on labor precaution.   1737: Discharge instructions given with pt understanding using stratus.  Pt left unit via ambulatory.

## 2017-07-24 ENCOUNTER — Inpatient Hospital Stay (HOSPITAL_COMMUNITY): Payer: Medicaid Other | Admitting: Anesthesiology

## 2017-07-24 ENCOUNTER — Encounter (HOSPITAL_COMMUNITY): Payer: Self-pay | Admitting: *Deleted

## 2017-07-24 ENCOUNTER — Inpatient Hospital Stay (HOSPITAL_COMMUNITY)
Admission: AD | Admit: 2017-07-24 | Discharge: 2017-07-27 | DRG: 807 | Disposition: A | Discharge status: Home / Self Care | Payer: Medicaid Other | Source: Ambulatory Visit | Attending: Obstetrics & Gynecology | Admitting: Obstetrics & Gynecology

## 2017-07-24 DIAGNOSIS — O9902 Anemia complicating childbirth: Secondary | ICD-10-CM | POA: Diagnosis present

## 2017-07-24 DIAGNOSIS — O99824 Streptococcus B carrier state complicating childbirth: Principal | ICD-10-CM | POA: Diagnosis present

## 2017-07-24 DIAGNOSIS — D649 Anemia, unspecified: Secondary | ICD-10-CM | POA: Diagnosis present

## 2017-07-24 DIAGNOSIS — Z3A39 39 weeks gestation of pregnancy: Secondary | ICD-10-CM

## 2017-07-24 DIAGNOSIS — Z3483 Encounter for supervision of other normal pregnancy, third trimester: Secondary | ICD-10-CM | POA: Diagnosis present

## 2017-07-24 DIAGNOSIS — Z3480 Encounter for supervision of other normal pregnancy, unspecified trimester: Secondary | ICD-10-CM

## 2017-07-24 HISTORY — DX: Other specified health status: Z78.9

## 2017-07-24 LAB — CBC
HEMATOCRIT: 35.1 % — AB (ref 36.0–46.0)
Hemoglobin: 11.1 g/dL — ABNORMAL LOW (ref 12.0–15.0)
MCH: 29.6 pg (ref 26.0–34.0)
MCHC: 31.6 g/dL (ref 30.0–36.0)
MCV: 93.6 fL (ref 78.0–100.0)
PLATELETS: 396 10*3/uL (ref 150–400)
RBC: 3.75 MIL/uL — AB (ref 3.87–5.11)
RDW: 14 % (ref 11.5–15.5)
WBC: 12.3 10*3/uL — ABNORMAL HIGH (ref 4.0–10.5)

## 2017-07-24 LAB — TYPE AND SCREEN
ABO/RH(D): B POS
ANTIBODY SCREEN: NEGATIVE

## 2017-07-24 LAB — ABO/RH: ABO/RH(D): B POS

## 2017-07-24 MED ORDER — LIDOCAINE HCL (PF) 1 % IJ SOLN
INTRAMUSCULAR | Status: DC | PRN
  Administered 2017-07-24: 6 mL via EPIDURAL
  Administered 2017-07-24: 5 mL via EPIDURAL

## 2017-07-24 MED ORDER — ACETAMINOPHEN 325 MG PO TABS: 650.0000 mg | ORAL_TABLET | ORAL | Status: DC | PRN

## 2017-07-24 MED ORDER — PENICILLIN G POT IN DEXTROSE 60000 UNIT/ML IV SOLN
3.0000 10*6.[IU] | INTRAVENOUS | Status: DC
  Administered 2017-07-24 – 2017-07-25 (×2): 3 10*6.[IU] via INTRAVENOUS
  Filled 2017-07-24 (×5): qty 50

## 2017-07-24 MED ORDER — OXYTOCIN 40 UNITS IN LACTATED RINGERS INFUSION - SIMPLE MED
2.5000 [IU]/h | INTRAVENOUS | Status: DC
Start: 2017-07-24 — End: 2017-07-25
  Administered 2017-07-25: 2.5 [IU]/h via INTRAVENOUS

## 2017-07-24 MED ORDER — PHENYLEPHRINE 40 MCG/ML (10ML) SYRINGE FOR IV PUSH (FOR BLOOD PRESSURE SUPPORT): 80.0000 ug | PREFILLED_SYRINGE | INTRAVENOUS | Status: DC | PRN

## 2017-07-24 MED ORDER — FENTANYL CITRATE (PF) 100 MCG/2ML IJ SOLN: 100.0000 ug | INTRAMUSCULAR | Status: DC | PRN

## 2017-07-24 MED ORDER — LACTATED RINGERS IV SOLN: 500.0000 mL | Freq: Once | INTRAVENOUS | Status: DC

## 2017-07-24 MED ORDER — FLEET ENEMA 7-19 GM/118ML RE ENEM: 1.0000 | ENEMA | RECTAL | Status: DC | PRN

## 2017-07-24 MED ORDER — LIDOCAINE HCL (PF) 1 % IJ SOLN
30.0000 mL | INTRAMUSCULAR | Status: AC | PRN
  Administered 2017-07-25: 30 mL via SUBCUTANEOUS
  Filled 2017-07-24: qty 30

## 2017-07-24 MED ORDER — EPHEDRINE 5 MG/ML INJ: 10.0000 mg | INTRAVENOUS | Status: DC | PRN

## 2017-07-24 MED ORDER — TERBUTALINE SULFATE 1 MG/ML IJ SOLN: 0.2500 mg | Freq: Once | INTRAMUSCULAR | Status: DC | PRN

## 2017-07-24 MED ORDER — LACTATED RINGERS IV SOLN
INTRAVENOUS | Status: DC
  Administered 2017-07-24: 21:00:00 via INTRAVENOUS

## 2017-07-24 MED ORDER — PHENYLEPHRINE 40 MCG/ML (10ML) SYRINGE FOR IV PUSH (FOR BLOOD PRESSURE SUPPORT)
80.0000 ug | PREFILLED_SYRINGE | INTRAVENOUS | Status: DC | PRN
  Filled 2017-07-24: qty 10

## 2017-07-24 MED ORDER — DIPHENHYDRAMINE HCL 50 MG/ML IJ SOLN: 12.5000 mg | INTRAMUSCULAR | Status: DC | PRN

## 2017-07-24 MED ORDER — DEXTROSE 5 % IV SOLN
5.0000 10*6.[IU] | Freq: Once | INTRAVENOUS | Status: AC
  Administered 2017-07-24: 5 10*6.[IU] via INTRAVENOUS
  Filled 2017-07-24: qty 5

## 2017-07-24 MED ORDER — OXYTOCIN BOLUS FROM INFUSION
500.0000 mL | Freq: Once | INTRAVENOUS | Status: AC
  Administered 2017-07-25: 500 mL via INTRAVENOUS

## 2017-07-24 MED ORDER — LACTATED RINGERS IV SOLN
500.0000 mL | INTRAVENOUS | Status: DC | PRN
  Administered 2017-07-24: 1000 mL via INTRAVENOUS

## 2017-07-24 MED ORDER — OXYCODONE-ACETAMINOPHEN 5-325 MG PO TABS: 2.0000 | ORAL_TABLET | ORAL | Status: DC | PRN

## 2017-07-24 MED ORDER — OXYTOCIN 40 UNITS IN LACTATED RINGERS INFUSION - SIMPLE MED
1.0000 m[IU]/min | INTRAVENOUS | Status: DC
  Administered 2017-07-24: 2 m[IU]/min via INTRAVENOUS
  Filled 2017-07-24: qty 1000

## 2017-07-24 MED ORDER — ONDANSETRON HCL 4 MG/2ML IJ SOLN: 4.0000 mg | Freq: Four times a day (QID) | INTRAMUSCULAR | Status: DC | PRN

## 2017-07-24 MED ORDER — OXYCODONE-ACETAMINOPHEN 5-325 MG PO TABS: 1.0000 | ORAL_TABLET | ORAL | Status: DC | PRN

## 2017-07-24 MED ORDER — SOD CITRATE-CITRIC ACID 500-334 MG/5ML PO SOLN: 30.0000 mL | ORAL | Status: DC | PRN

## 2017-07-24 MED ORDER — FENTANYL 2.5 MCG/ML BUPIVACAINE 1/10 % EPIDURAL INFUSION (WH - ANES)
14.0000 mL/h | INTRAMUSCULAR | Status: DC | PRN
  Administered 2017-07-24 – 2017-07-25 (×2): 14 mL/h via EPIDURAL
  Filled 2017-07-24 (×2): qty 100

## 2017-07-24 NOTE — MAU Note (Signed)
Pt reports contractions, denies bleeding or ROM.  

## 2017-07-24 NOTE — Anesthesia Preprocedure Evaluation (Signed)
Anesthesia Evaluation  Patient identified by MRN, date of birth, ID band Patient awake    Reviewed: Allergy & Precautions, H&P , NPO status , Patient's Chart, lab work & pertinent test results  Airway Mallampati: I  TM Distance: >3 FB Neck ROM: full    Dental no notable dental hx. (+) Teeth Intact   Pulmonary neg pulmonary ROS,    Pulmonary exam normal breath sounds clear to auscultation       Cardiovascular negative cardio ROS Normal cardiovascular exam Rhythm:regular Rate:Normal     Neuro/Psych negative neurological ROS  negative psych ROS   GI/Hepatic negative GI ROS, Neg liver ROS,   Endo/Other  negative endocrine ROS  Renal/GU negative Renal ROS  negative genitourinary   Musculoskeletal negative musculoskeletal ROS (+)   Abdominal Normal abdominal exam  (+)   Peds  Hematology  (+) anemia ,   Anesthesia Other Findings   Reproductive/Obstetrics (+) Pregnancy                             Anesthesia Physical Anesthesia Plan  ASA: II  Anesthesia Plan: Epidural   Post-op Pain Management:    Induction:   PONV Risk Score and Plan:   Airway Management Planned:   Additional Equipment:   Intra-op Plan:   Post-operative Plan:   Informed Consent: I have reviewed the patients History and Physical, chart, labs and discussed the procedure including the risks, benefits and alternatives for the proposed anesthesia with the patient or authorized representative who has indicated his/her understanding and acceptance.     Plan Discussed with:   Anesthesia Plan Comments:         Anesthesia Quick Evaluation

## 2017-07-24 NOTE — Progress Notes (Signed)
Pt refusing Foley placement.  Education given through the interpreter.  Pt continues to refuse catheter.  Unable to pee while on bedpan at this time.  Will continue to monitor.

## 2017-07-24 NOTE — H&P (Signed)
OBSTETRIC ADMISSION HISTORY AND PHYSICAL  Tara Chapman is a 21 y.o. female G1P0 with IUP at 4337w5d by early ultrasound in [place] presenting for SOL. She reports contractions since this AM. She reports +FMs, No LOF, no VB, no blurry vision, headaches or peripheral edema, and RUQ pain.  She plans on breast feeding. She request nothing for birth control. [facility]   Dating: By ultrasound in [place] --->  Estimated Date of Delivery: 07/26/17   [facility]inic [facility] Prenatal Labs  Dating  US done in [place] Blood type: B/Positive/-- (06/13 1105)   Genetic Screen  Quad:  02/28/17 - negative Antibody:Negative (06/13 1105)  Anatomic US  normal - limited views of spine - female [x] F/U Nml Rubella: 20.30 (06/13 1105)  GTT Early:               Third trimester: (Done early at 25.5 per strong pt preference. Consider random CBG at future visits.) [phone] RPR: Non Reactive (06/13 1105)   Flu vaccine 05/05/17 HBsAg: Negative (06/13 1105)   TDaP vaccine 05/01/17                                      Rhogam: N/A HIV: Non Reactive (06/13 1105)   Baby Food   Breast                                        GBS: (For PCN allergy, check sensitivities)  Contraception   none Pap: negative  Circumcision   yes   Pediatrician Can't remember name   Support Person Her father   Prenatal Classes Discussed    Prenatal History/Complications:  Past Medical History: Past Medical History:  Diagnosis Date  . Medical history non-contributory     Past Surgical History: Past Surgical History:  Procedure Laterality Date  . NO PAST SURGERIES      Obstetrical History: OB History    Gravida Para Term Preterm AB Living   1             SAB TAB Ectopic Multiple Live Births                  Social History: Social History   Socioeconomic History  . Marital status: Married    Spouse name: None  . Number of children: None  . Years of education: None  . Highest education level: None   Social Needs  . Financial resource strain: None  . Food insecurity - worry: None  . Food insecurity - inability: None  . Transportation needs - medical: None  . Transportation needs - non-medical: None  Occupational History  . None  Tobacco Use  . Smoking status: Never Smoker  . Smokeless tobacco: Never Used  Substance and Sexual Activity  . Alcohol use: No  . Drug use: No  . Sexual activity: No  Other Topics Concern  . None  Social History Narrative  . None    Family History: History reviewed. No pertinent family history.  Allergies: No Known Allergies  Medications Prior to Admission  Medication Sig Dispense Refill Last Dose  . famotidine (PEPCID) 20 MG tablet Take 1 tablet (20 mg total) by mouth 2 (two) times daily. 60 tablet 3 Past Month at Unknown time  . Prenatal Multivit-Min-Fe-FA (PRENATAL VITAMINS) 0.8 MG tablet  Take 1 tablet by mouth daily. 30 tablet 12 Past Month at Unknown time  . acetaminophen (TYLENOL) 500 MG tablet Take 2 tablets (1,000 mg total) by mouth every 6 (six) hours as needed. (Patient not taking: Reported on 07/24/2017) 30 tablet 0 Not Taking at Unknown time  . benzonatate (TESSALON PERLES) 100 MG capsule Take 1 capsule (100 mg total) by mouth 3 (three) times daily as needed for cough. (Patient not taking: Reported on 07/24/2017) 30 capsule 0 Not Taking at Unknown time  . guaiFENesin (MUCINEX) 600 MG 12 hr tablet Take 2 tablets (1,200 mg total) by mouth 2 (two) times daily as needed. (Patient not taking: Reported on 07/24/2017) 20 tablet 0 Not Taking at Unknown time  . terconazole (TERAZOL 7) 0.4 % vaginal cream Place 1 applicator at bedtime vaginally. (Patient not taking: Reported on 07/24/2017) 45 g 0 Not Taking at Unknown time     Review of Systems   All systems reviewed and negative except as stated in HPI  Last menstrual period 08/20/2016, SpO2 100 %. General appearance: alert, cooperative and no distress Lungs: clear to auscultation  bilaterally Heart: regular rate and rhythm Abdomen: soft, non-tender; bowel sounds normal Pelvic: n/a Extremities: Homans sign is negative, no sign of DVT DTR's +2 Presentation: cephalic Fetal monitoringBaseline: 145 bpm, Variability: Good {> 6 bpm), Accelerations: Reactive and Decelerations: Absent Uterine activityFrequency: Every 5-6 minutes Dilation: 5.5 Effacement (%): 80 Station: -3 Exam by:: Ginnie Smartachel Schmidt RN   Prenatal labs: ABO, Rh: B/Positive/-- (06/13 1105) Antibody: Negative (06/13 1105) Rubella: 20.30 (06/13 1105) RPR: Non Reactive (08/29 1318)  HBsAg: Negative (06/13 1105)  HIV:    GBS:   POS  Prenatal Transfer Tool  Maternal Diabetes: No Genetic Screening: Normal Maternal Ultrasounds/Referrals: Normal Fetal Ultrasounds or other Referrals:  None Maternal Substance Abuse:  No Significant Maternal Medications:  None Significant Maternal Lab Results: Lab values include: Group B Strep positive  No results found for this or any previous visit (from the past 24 hour(s)).  Patient Active Problem List   Diagnosis Date Noted  . Positive GBS test 07/10/2017  . Pain of round ligament affecting pregnancy, antepartum 06/20/2017  . Language barrier, cultural differences 01/31/2017  . Supervision of other normal pregnancy, antepartum 01/30/2017    Assessment/Plan:  Tara Chapman is a 21 y.o. G1P0 at 3272w5d here for Spontaneous onset of labor  #Labor: Expectant management #Pain: epidural #FWB: Cat 1 #ID:  GBS pos- PCN #MOF: Breast #MOC: none #Circ:  Yes, outpatient  Rolm Bookbinderaroline M Neill, CNM  07/24/2017, 4:01 PM

## 2017-07-24 NOTE — Progress Notes (Signed)
Using [employer] for interpretation.  Arabic is only language spoken by pt and mother.

## 2017-07-24 NOTE — Progress Notes (Signed)
Patient ID: Tara Chapman, female   DOB: Dec 21, 1995, 21 y.o.   MRN: [id number]  Comfortable w/ epidural; PCN x 1  BP 120/65, other VSS FHR 140s, +accels, no decels Ctx q 3-4 mins Cx deferred (was 5/100/-1 at 5p)  IUP@term  GBS pos Early active labor  Will check cx in 1-2 hrs for progress Anticipate SVD  Cam HaiSHAW, Richey Doolittle CNM 07/24/2017

## 2017-07-24 NOTE — Anesthesia Procedure Notes (Signed)
Epidural Patient location during procedure: OB Start time: 07/24/2017 4:53 PM End time: 07/24/2017 4:57 PM  Staffing Anesthesiologist: Leilani AbleHatchett, Yassen Kinnett, MD Performed: anesthesiologist   Preanesthetic Checklist Completed: patient identified, surgical consent, pre-op evaluation, timeout performed, IV checked, risks and benefits discussed and monitors and equipment checked  Epidural Patient position: sitting Prep: site prepped and draped and DuraPrep Patient monitoring: continuous pulse ox and blood pressure Approach: midline Location: L3-L4 Injection technique: LOR air  Needle:  Needle type: Tuohy  Needle gauge: 17 G Needle length: 9 cm and 9 Needle insertion depth: 5 cm cm Catheter type: closed end flexible Catheter size: 19 Gauge Catheter at skin depth: 10 cm Test dose: negative and Other  Assessment Sensory level: T9 Events: blood not aspirated, injection not painful, no injection resistance, negative IV test and no paresthesia  Additional Notes Reason for block:procedure for pain

## 2017-07-25 ENCOUNTER — Encounter (HOSPITAL_COMMUNITY): Payer: Self-pay

## 2017-07-25 DIAGNOSIS — O99824 Streptococcus B carrier state complicating childbirth: Secondary | ICD-10-CM

## 2017-07-25 DIAGNOSIS — Z3A39 39 weeks gestation of pregnancy: Secondary | ICD-10-CM

## 2017-07-25 LAB — RPR: RPR: NONREACTIVE

## 2017-07-25 MED ORDER — OXYCODONE HCL 5 MG PO TABS: 5.0000 mg | ORAL_TABLET | ORAL | Status: DC | PRN

## 2017-07-25 MED ORDER — ONDANSETRON HCL 4 MG PO TABS: 4.0000 mg | ORAL_TABLET | ORAL | Status: DC | PRN

## 2017-07-25 MED ORDER — DIPHENHYDRAMINE HCL 25 MG PO CAPS: 25.0000 mg | ORAL_CAPSULE | Freq: Four times a day (QID) | ORAL | Status: DC | PRN

## 2017-07-25 MED ORDER — ONDANSETRON HCL 4 MG/2ML IJ SOLN: 4.0000 mg | INTRAMUSCULAR | Status: DC | PRN

## 2017-07-25 MED ORDER — COCONUT OIL OIL
1.0000 | TOPICAL_OIL | Status: DC | PRN
Start: 2017-07-25 — End: 2017-07-27
  Filled 2017-07-25: qty 120

## 2017-07-25 MED ORDER — SENNOSIDES-DOCUSATE SODIUM 8.6-50 MG PO TABS
2.0000 | ORAL_TABLET | ORAL | Status: DC
  Administered 2017-07-26 (×2): 2 via ORAL
  Filled 2017-07-25 (×2): qty 2

## 2017-07-25 MED ORDER — IBUPROFEN 600 MG PO TABS
600.0000 mg | ORAL_TABLET | Freq: Four times a day (QID) | ORAL | Status: DC
  Administered 2017-07-25 – 2017-07-27 (×9): 600 mg via ORAL
  Filled 2017-07-25 (×9): qty 1

## 2017-07-25 MED ORDER — BENZOCAINE-MENTHOL 20-0.5 % EX AERO
1.0000 "application " | INHALATION_SPRAY | CUTANEOUS | Status: DC | PRN
  Administered 2017-07-25: 1 via TOPICAL
  Filled 2017-07-25 (×2): qty 56

## 2017-07-25 MED ORDER — PRENATAL MULTIVITAMIN CH
1.0000 | ORAL_TABLET | Freq: Every day | ORAL | Status: DC
  Administered 2017-07-25 – 2017-07-27 (×3): 1 via ORAL
  Filled 2017-07-25 (×3): qty 1

## 2017-07-25 MED ORDER — TETANUS-DIPHTH-ACELL PERTUSSIS 5-2.5-18.5 LF-MCG/0.5 IM SUSP: 0.5000 mL | Freq: Once | INTRAMUSCULAR | Status: DC

## 2017-07-25 MED ORDER — ZOLPIDEM TARTRATE 5 MG PO TABS: 5.0000 mg | ORAL_TABLET | Freq: Every evening | ORAL | Status: DC | PRN

## 2017-07-25 MED ORDER — WITCH HAZEL-GLYCERIN EX PADS: 1.0000 "application " | MEDICATED_PAD | CUTANEOUS | Status: DC | PRN

## 2017-07-25 MED ORDER — DIBUCAINE 1 % RE OINT
1.0000 "application " | TOPICAL_OINTMENT | RECTAL | Status: DC | PRN
  Filled 2017-07-25: qty 28

## 2017-07-25 MED ORDER — SIMETHICONE 80 MG PO CHEW: 80.0000 mg | CHEWABLE_TABLET | ORAL | Status: DC | PRN

## 2017-07-25 MED ORDER — ACETAMINOPHEN 325 MG PO TABS
650.0000 mg | ORAL_TABLET | ORAL | Status: DC | PRN
  Administered 2017-07-25: 650 mg via ORAL
  Filled 2017-07-25 (×2): qty 2

## 2017-07-25 NOTE — Progress Notes (Signed)
Tara Chapman is a 21 y.o. G1P0 at 7637w6d here for SOL.  Subjective: Patient is resting comfortably in bed. She denies any pain but feels pressure contractions. She denies any concerns at this time.   Objective: BP 126/71   Pulse 75   Temp 98.4 F (36.9 C)   Resp 16   Ht 5\' 6"  (1.676 m)   Wt 69.9 kg (154 lb)   LMP 08/20/2016 (Approximate)   SpO2 100%   BMI 24.86 kg/m    FHT:  FHR: 145 bpm, variability: moderate,  accelerations: 15x15,  decelerations:  none UC:   Q 2-5 minutes, 70-120 Dilation: 5.5 Effacement (%): 100 Cervical Position: (left) Station: -1 Presentation: Vertex Exam by:: Sherrell PullerSade Harper,RN  Labs: Results for orders placed or performed during the hospital encounter of 07/24/17 (from the past 24 hour(s))  CBC     Status: Abnormal   Collection Time: 07/24/17  4:09 PM  Result Value Ref Range   WBC 12.3 (H) 4.0 - 10.5 K/uL   RBC 3.75 (L) 3.87 - 5.11 MIL/uL   Hemoglobin 11.1 (L) 12.0 - 15.0 g/dL   HCT 47.835.1 (L) 29.536.0 - 62.146.0 %   MCV 93.6 78.0 - 100.0 fL   MCH 29.6 26.0 - 34.0 pg   MCHC 31.6 30.0 - 36.0 g/dL   RDW 30.814.0 65.711.5 - 84.615.5 %   Platelets 396 150 - 400 K/uL  Type and screen [facility]     Status: None   Collection Time: 07/24/17  4:09 PM  Result Value Ref Range   ABO/RH(D) B POS    Antibody Screen NEG    Sample Expiration 07/27/2017   ABO/Rh     Status: None   Collection Time: 07/24/17  4:09 PM  Result Value Ref Range   ABO/RH(D) B POS     Assessment / Plan: 6537w6d week IUP here for SOL.  Labor: Continuing on Pitocin Fetal Wellbeing:  Category 1 Pain Control:  Epidural Anticipated MOD:  SVD  Francie MassingBrown, Danija Gosa, Medical Student 07/25/2017 12:35 AM   [employer] interpreter Roseanne RenoHassan #[id number] was used for this encounter.

## 2017-07-25 NOTE — Anesthesia Postprocedure Evaluation (Signed)
Anesthesia Post Note  Patient: Tara Chapman  Procedure(s) Performed: AN AD HOC LABOR EPIDURAL     Patient location during evaluation: Mother Baby Anesthesia Type: Epidural Level of consciousness: awake Pain management: pain level controlled Vital Signs Assessment: post-procedure vital signs reviewed and stable Respiratory status: spontaneous breathing Cardiovascular status: stable Postop Assessment: no headache, no backache, epidural receding, patient able to bend at knees, no apparent nausea or vomiting and adequate PO intake Anesthetic complications: no    Last Vitals:  Vitals:   07/25/17 0600 07/25/17 1000  BP: 130/78 123/73  Pulse: 82 80  Resp: 18 19  Temp: 37.3 C 37.2 C  SpO2: 100%     Last Pain:  Vitals:   07/25/17 1201  TempSrc:   PainSc: 0-No pain   Pain Goal:                 [name]

## 2017-07-25 NOTE — Progress Notes (Signed)
Parent request formula to supplement breast feeding due to Parent feels baby is not getting enough from breast. Parents have been informed of small tummy size of newborn, taught hand expression and understands the possible consequences of formula to the health of the infant. The possible consequences shared with patient include 1) Loss of confidence in breastfeeding 2) Engorgement 3) Allergic sensitization of baby(asthma/allergies) and 4) decreased milk supply for mother.After discussion of the above the mother decided to supplement with formula. .The  tool used to give formula supplement will be a bottle.

## 2017-07-25 NOTE — Progress Notes (Addendum)
Patient ID: Tara HolmsHana Abulnasir Babiker Medel, female   DOB: 1995-12-09, 21 y.o.   MRN: [id number]  Feeling some pressure; on R side using peanut ball  BP 117/52, other VSS FHR 140s, +accels, no decels, occ variables Ctx q 3 mins w/ Pit @ 1064mu/min Cx at 0208 C/C/+1  IUP@term  End 1st stage  Will allow to labor down and then push w/ strong urge Anticipate SVD  Loni Delbridge CNM 07/25/2017 2:26 AM

## 2017-07-25 NOTE — Lactation Note (Signed)
This note was copied from a baby's chart. Lactation Consultation Note  Patient Name: Boy Shirline FreesMohammed KGMWN'UToday's Date: 07/25/2017 Reason for consult: Initial assessment;Term;Primapara Breastfeeding consultation services and support information given to patient.  This is her first baby and newborn is 17 hours old.  Mom states she doesn't have milk so she asked for formula.  Discussed colostrum and formula is not needed at this point.  Baby is currently sleeping.  Instructed to call for assist with feeding cues.  Maternal Data    Feeding Feeding Type: Bottle Fed - Formula Nipple Type: Slow - flow  LATCH Score Latch: Too sleepy or reluctant, no latch achieved, no sucking elicited.(mom asking for a bottle. LEAD done)  Audible Swallowing: None  Type of Nipple: Flat  Comfort (Breast/Nipple): Soft / non-tender  Hold (Positioning): Full assist, staff holds infant at breast  LATCH Score: 3  Interventions    Lactation Tools Discussed/Used     Consult Status Consult Status: Follow-up Date: 07/25/17 Follow-up type: In-patient    Huston FoleyMOULDEN, Kayman Snuffer S 07/25/2017, 10:38 AM

## 2017-07-26 NOTE — Progress Notes (Signed)
**   Used Engineer, petroleumArabic video interpreter for visit** Post Partum Day #1 Subjective: no complaints, up ad lib and tolerating PO; breastfeeding going slowly; desires POPs for contraception- discussed when to start, how to take, when they become effective  Objective: Blood pressure 113/60, pulse 75, temperature 98.6 F (37 C), temperature source Oral, resp. rate 16, height 5\' 6"  (1.676 m), weight 62.6 kg (137 lb 14.4 oz), last menstrual period 08/20/2016, SpO2 100 %, unknown if currently breastfeeding.  Physical Exam:  General: alert, cooperative and no distress Lochia: appropriate Uterine Fundus: firm DVT Evaluation: No evidence of DVT seen on physical exam.  Recent Labs    07/24/17 1609  HGB 11.1*  HCT 35.1*    Assessment/Plan: Plan for discharge tomorrow and Lactation consult   LOS: 2 days   Cam HaiSHAW, Aretha Levi CNM 07/26/2017, 12:44 PM

## 2017-07-26 NOTE — Lactation Note (Signed)
This note was copied from a baby's chart. Lactation Consultation Note  Patient Name: Tara Shirline FreesMohammed UJWJX'BToday's Date: 07/26/2017 Reason for consult: Follow-up assessment;1st time breastfeeding;Primapara;Difficult latch   Follow-up at 38 hrs old; P1.  DEBP set up by RN and mom got a few drops.  No English - speaks Arabic.   Infant has breastfed x3 (10-15 + a 63 min on/off) + attempts x3 (0-5 min) + formula via bottle x5 (2-15 ml) in past 24 hrs; voids-3 in 24 hrs / 4 life; stools-4 in 24 hrs / 5 life.    MGM in room holding baby with her own ~563 month old baby laying on couch.  MGM telling mom "no milk."  Infant showing feeding cues. Spoke to mom using [employer] interpreter "Enry [phone]#[id number]".   Lots basic teaching on milk supply and demand.  Encouraged mom to breastfeeding first with each feeding and offering second breast after first. Taught hand expression with return demonstration and observation of colostrum easily appearing.  Educated mom that she has milk.  Educated on supply and demand, educated that formula and bottles interfere with mom's ability to make milk. Worked with mom on positioning with good breast and baby support using cross-cradle on right side.  Baby tongue thrusting so LC did some suck training.  After a few more attempts baby latched and sucked in a slow rhythm but maintained latch for over a total of 15 minutes.   As LC was about to leave room, asked mom and MGM if they had any questions, and MGM told LC the baby needed another bottle of milk bc "no milk." Mom took baby off breast as LC left room. LC reported to RN of consult and RN to get mom another bottle of milk.     Maternal Data Has patient been taught Hand Expression?: Yes(with return demonstration and observation of colostrum appearing) Does the patient have breastfeeding experience prior to this delivery?: No  Feeding Feeding Type: Breast Fed Length of feed: 15 min  LATCH Score Latch: Repeated attempts needed to sustain  latch, nipple held in mouth throughout feeding, stimulation needed to elicit sucking reflex.  Audible Swallowing: A few with stimulation  Type of Nipple: Everted at rest and after stimulation  Comfort (Breast/Nipple): Soft / non-tender  Hold (Positioning): Assistance needed to correctly position infant at breast and maintain latch.  LATCH Score: 7  Interventions Interventions: Breast feeding basics reviewed;Adjust position;Assisted with latch;Support pillows;Skin to skin;Expressed milk;Hand express  Lactation Tools Discussed/Used WIC Program: Yes Initiated by:: RN   Consult Status Consult Status: Follow-up Follow-up type: In-patient    Tara Chapman, Tara Chapman 07/26/2017, 5:20 PM

## 2017-07-27 NOTE — Discharge Summary (Signed)
OB Discharge Summary     Patient Name: Tara Chapman DOB: [birth year] MRN: [id number]  Date of admission: 07/24/2017 Delivering MD: Cam HaiSHAW, KIMBERLY D   Date of discharge: 07/27/2017  Admitting diagnosis: 40WKS, PAIN Intrauterine pregnancy: 2037w6d     Secondary diagnosis:  Active Problems:   Normal labor and delivery  Additional problems: GBS pos     Discharge diagnosis: Term Pregnancy Delivered                                                                                                Post partum procedures:none  Augmentation: Pitocin  Complications: None  Hospital course:  Onset of Labor With Vaginal Delivery     21 y.o. yo G1P1001 at 6937w6d was admitted in Latent Labor on 07/24/2017. Patient had an uncomplicated labor course as follows:  Membrane Rupture Time/Date: 12:50 AM ,07/25/2017   Intrapartum Procedures: Episiotomy:                                           Lacerations:  1st degree [2];Perineal [11]  Patient had a delivery of a Viable infant. 07/25/2017  Information for the patient's newborn:  Philipp OvensMohammed, Boy [[id number]]  Delivery Method: Vaginal, Spontaneous(Filed from Delivery Summary)    Pateint had an uncomplicated postpartum course.  She is ambulating, tolerating a regular diet, passing flatus, and urinating well. Patient is discharged home in stable condition on 07/27/17.   Physical exam  Vitals:   07/25/17 1753 07/26/17 0605 07/26/17 1824 07/27/17 0500  BP: 120/69 113/60 110/77 (!) 112/57  Pulse: 73 75 91 85  Resp: 18 16 18 14   Temp: 98.5 F (36.9 C) 98.6 F (37 C) 98.6 F (37 C) 98.2 F (36.8 C)  TempSrc: Oral Oral Oral Oral  SpO2:      Weight:  62.6 kg (137 lb 14.4 oz)    Height:       General: alert, cooperative and no distress Lochia: appropriate Uterine Fundus: firm Incision: N/A DVT Evaluation: No evidence of DVT seen on physical exam. Labs: Lab Results  Component Value Date   WBC 12.3 (H) 07/24/2017   HGB 11.1 (L)  07/24/2017   HCT 35.1 (L) 07/24/2017   MCV 93.6 07/24/2017   PLT 396 07/24/2017   CMP Latest Ref Rng & Units 01/20/2017  Glucose 65 - 99 mg/dL 82  BUN 6 - 20 mg/dL 6  Creatinine 7.820.44 - 9.561.00 mg/dL 2.130.57  Sodium 086135 - 578145 mmol/L 132(L)  Potassium 3.5 - 5.1 mmol/L 3.7  Chloride 101 - 111 mmol/L 99(L)  CO2 22 - 32 mmol/L 24  Calcium 8.9 - 10.3 mg/dL 9.6  Total Protein 6.5 - 8.1 g/dL 4.6(N8.5(H)  Total Bilirubin 0.3 - 1.2 mg/dL 6.2(X0.2(L)  Alkaline Phos 38 - 126 U/L 40  AST 15 - 41 U/L 20  ALT 14 - 54 U/L 14    Discharge instruction: per After Visit Summary and "Baby and Me Booklet".  After visit meds:  Allergies as of 07/27/2017  No Known Allergies     Medication List    TAKE these medications   acetaminophen 500 MG tablet Commonly known as:  TYLENOL Take 2 tablets (1,000 mg total) by mouth every 6 (six) hours as needed.   benzonatate 100 MG capsule Commonly known as:  TESSALON PERLES Take 1 capsule (100 mg total) by mouth 3 (three) times daily as needed for cough.   famotidine 20 MG tablet Commonly known as:  PEPCID Take 1 tablet (20 mg total) by mouth 2 (two) times daily.   guaiFENesin 600 MG 12 hr tablet Commonly known as:  MUCINEX Take 2 tablets (1,200 mg total) by mouth 2 (two) times daily as needed.   Prenatal Vitamins 0.8 MG tablet Take 1 tablet by mouth daily.   terconazole 0.4 % vaginal cream Commonly known as:  TERAZOL 7 Place 1 applicator at bedtime vaginally.       Diet: routine diet  Activity: Advance as tolerated. Pelvic rest for 6 weeks.   Outpatient follow up:6 weeks Follow up Appt: Future Appointments  Date Time Provider Department Center  07/29/2017  3:40 PM Armando ReichertHogan, Heather D, CNM [facility]   Follow up Visit:No Follow-up on file.  Postpartum contraception: Progesterone only pills  Newborn Data: Live born female  Birth Weight: 6 lb 11.6 oz (3050 g) APGAR: 8, 9  Newborn Delivery   Birth date/time:  07/25/2017 03:01:00 Delivery type:   Vaginal, Spontaneous     Baby Feeding: Breast Disposition:home with mother   07/27/2017 Marthenia RollingScott Evy Lutterman, DO

## 2017-07-27 NOTE — Progress Notes (Signed)
Discharge education and paperwork discussed.  Reasons to call the MD reviewed.  Pt verbalized understanding. Interpreter [phone] used.

## 2017-07-29 ENCOUNTER — Encounter: Payer: Medicaid Other | Admitting: Advanced Practice Midwife

## 2017-09-10 ENCOUNTER — Encounter: Payer: Self-pay | Admitting: Family Medicine

## 2017-09-10 ENCOUNTER — Ambulatory Visit (INDEPENDENT_AMBULATORY_CARE_PROVIDER_SITE_OTHER): Payer: Medicaid Other | Admitting: Family Medicine

## 2017-09-10 DIAGNOSIS — Z30011 Encounter for initial prescription of contraceptive pills: Secondary | ICD-10-CM

## 2017-09-10 DIAGNOSIS — Z603 Acculturation difficulty: Secondary | ICD-10-CM

## 2017-09-10 DIAGNOSIS — Z3689 Encounter for other specified antenatal screening: Secondary | ICD-10-CM | POA: Diagnosis not present

## 2017-09-10 MED ORDER — NORETHINDRONE 0.35 MG PO TABS: 1.0000 | ORAL_TABLET | Freq: Every day | ORAL | 3 refills | Status: DC

## 2017-09-10 NOTE — Progress Notes (Signed)
Subjective:     Tara Chapman is a 22 y.o. female who presents for a postpartum visit. She is 6 weeks postpartum following a spontaneous vaginal delivery. I have fully reviewed the prenatal and intrapartum course. The delivery was at 39 gestational weeks. Outcome: spontaneous vaginal delivery. Anesthesia: epidural. Postpartum course has been uncomplicated. Baby's course has been uncomplicated. Baby is feeding by breast. Bleeding no bleeding. Bowel function is normal. Bladder function is normal. Patient is sexually active. Contraception method: would like POPs Postpartum depression screening: negative.  The following portions of the patient's history were reviewed and updated as appropriate: allergies, current medications, past family history, past medical history, past social history, past surgical history and problem list.  Review of Systems Pertinent items noted in HPI and remainder of comprehensive ROS otherwise negative.   Objective:    BP (!) 95/59   Pulse 75   Wt 138 lb 14.4 oz (63 kg)   BMI 22.42 kg/m    General:  Well-appearing female in NAD   HENT:  [place]/AT, normal oral mucosa  Eyes: Normal conjunctivae, no scleral icterus  Lungs:  Normal respiratory effort  Heart: Normal rate  Abdomen: Soft, ND, ND  Skin : Warm, dry.  MSK: No LE edema  Psych: Normal mood, appropriate affect  Neuro:  Alert and oriented x3        Assessment:    NOrmal postpartum exam. Pap smear not done at today's visit (UTD).   Plan:    1. Contraception: oral progesterone-only contraceptive - discussed risk/benefits and potential side-effects; recommended switching for estrogen-progesterone pill or other method once she is no longer breast feeding. Rx for Mircronor sent to pharmacy 2. Language barrier: interpreter used for visit 3. Follow up in: 1 year or as needed.    Raynelle FanningJulie P. Degele, MD OB Fellow

## 2017-09-10 NOTE — Progress Notes (Signed)
Pt wants to discuss Contraceptives. Was previously told at Hospital that they would send Birth Controll pills to Pharmacy but never did.

## 2019-12-25 ENCOUNTER — Encounter: Payer: Medicaid Other | Admitting: Family Medicine

## 2019-12-28 ENCOUNTER — Encounter: Payer: Self-pay | Admitting: Family Medicine

## 2020-01-12 ENCOUNTER — Encounter: Payer: Self-pay | Admitting: Registered Nurse

## 2020-01-12 ENCOUNTER — Other Ambulatory Visit: Payer: Self-pay

## 2020-01-12 ENCOUNTER — Ambulatory Visit: Payer: Self-pay | Admitting: Registered Nurse

## 2020-01-12 VITALS — BP 112/78 | HR 72 | Temp 98.0°F

## 2020-01-12 DIAGNOSIS — L309 Dermatitis, unspecified: Secondary | ICD-10-CM

## 2020-01-12 DIAGNOSIS — J00 Acute nasopharyngitis [common cold]: Secondary | ICD-10-CM

## 2020-01-12 DIAGNOSIS — H6992 Unspecified Eustachian tube disorder, left ear: Secondary | ICD-10-CM

## 2020-01-12 DIAGNOSIS — H6123 Impacted cerumen, bilateral: Secondary | ICD-10-CM

## 2020-01-12 DIAGNOSIS — H6982 Other specified disorders of Eustachian tube, left ear: Secondary | ICD-10-CM

## 2020-01-12 MED ORDER — PHENYLEPHRINE HCL 5 MG PO TABS: 5.0000 mg | ORAL_TABLET | Freq: Four times a day (QID) | ORAL | Status: AC | PRN

## 2020-01-12 MED ORDER — LORATADINE 10 MG PO TABS: 10.0000 mg | ORAL_TABLET | Freq: Every day | ORAL | 11 refills | Status: DC

## 2020-01-12 MED ORDER — SALINE SPRAY 0.65 % NA SOLN: 2.0000 | NASAL | 0 refills | Status: DC

## 2020-01-12 MED ORDER — IBUPROFEN 800 MG PO TABS: 800.0000 mg | ORAL_TABLET | Freq: Three times a day (TID) | ORAL | 0 refills | Status: DC

## 2020-01-12 MED ORDER — AQUAPHOR EX OINT: TOPICAL_OINTMENT | CUTANEOUS | 0 refills | Status: DC | PRN

## 2020-01-12 NOTE — Patient Instructions (Addendum)
Otitis Media, Adult  Otitis media occurs when there is inflammation and fluid in the middle ear. Your middle ear is a part of the ear that contains bones for hearing as well as air that helps send sounds to your brain. What are the causes? This condition is caused by a blockage in the eustachian tube. This tube drains fluid from the ear to the back of the nose (nasopharynx). A blockage in this tube can be caused by an object or by swelling (edema) in the tube. Problems that can cause a blockage include:  A cold or other upper respiratory infection.  Allergies.  An irritant, such as tobacco smoke. Enlarged adenoids. The adenoids are areas of soft tissue located highhen Nonallergic Rhinitis Nonallergic rhinitis is a condition that causes symptoms that affect the nose, such as a runny nose and a stuffed-up nose (nasal congestion) that can make it hard to breathe through the nose. This condition is different from having an allergy (allergic rhinitis). Allergic rhinitis occurs when the body's defense system (immune system) reacts to a substance that you are allergic to (allergen), such as pollen, pet dander, mold, or dust. Nonallergic rhinitis has many similar symptoms, but it is not caused by allergens. Nonallergic rhinitis can be a short-term or long-term problem. What are the causes? This condition can be caused by many different things. Some common types of nonallergic rhinitis include: Infectious rhinitis  This is usually due to an infection in the upper respiratory tract. Vasomotor rhinitis  This is the most common type of long-term nonallergic rhinitis.  It is caused by too much blood flow through the nose, which makes the tissue inside of the nose swell.  Symptoms are often triggered by strong odors, cold air, stress, drinking alcohol, cigarette smoke, or changes in the weather. Occupational rhinitis  This type is caused by triggers in the workplace, such as chemicals, dusts, animal  dander, or air pollution. Hormonal rhinitis  This type occurs in women as a result of an increase in the female hormone estrogen.  It may occur during pregnancy, puberty, and menstrual cycles.  Symptoms improve when estrogen levels drop. Drug-induced rhinitis Several drugs can cause nonallergic rhinitis, including:  Medicines that are used to treat high blood pressure, heart disease, and Parkinson disease.  Aspirin and NSAIDs.  Over-the-counter nasal decongestant sprays. These can cause a type of nonallergic rhinitis (rhinitis medicamentosa) when they are used for more than a few days. Nonallergic rhinitis with eosinophilia syndrome (NARES)  This type is caused by having too much of a certain type of white blood cell (eosinophil). Nonallergic rhinitis can also be caused by a reaction to eating hot or spicy foods. This does not usually cause long-term symptoms. In some cases, the cause of nonallergic rhinitis is not known. What increases the risk? You are more likely to develop this condition if:  You are 59-103 years of age.  You are a woman. Women are twice as likely to have this condition. What are the signs or symptoms? Common symptoms of this condition include:  Nasal congestion.  Runny nose.  The feeling of mucus going down the back of the throat (postnasal drip).  Trouble sleeping at night and daytime sleepiness. Less common symptoms include:  Sneezing.  Coughing.  Itchy nose.  Bloodshot eyes. How is this diagnosed? This condition may be diagnosed based on:  Your symptoms and medical history.  A physical exam.  Allergy testing to rule out allergic rhinitis. You may have skin tests or blood  tests. In some cases, the health care provider may take a swab of nasal secretions to look for an increased number of eosinophils. This would be done to confirm a diagnosis of NARES. How is this treated? Treatment for this condition depends on the cause. No single  treatment works for everyone. Work with your health care provider to find the best treatment for you. Treatment may include:  Avoiding the things that trigger your symptoms.  Using medicines to relieve congestion, such as: ? Steroid nasal spray. There are many types. You may need to try a few to find out which one works best. ? Decongestant medicine. This may be an oral medicine or a nasal spray. These medicines are only used for a short time.  Using medicines to relieve a runny nose. These may include antihistamine medicines or anticholinergic nasal sprays.  Surgery to remove tissue from inside the nose may be needed in severe cases if the condition has not improved after 6-12 months of medical treatment. Follow these instructions at home:  Take or use over-the-counter and prescription medicines only as told by your health care provider. Do not stop using your medicine even if you start to feel better.  Use salt-water (saline) rinses or other solutions (nasal washes or irrigations) to wash or rinse out the inside of your nose as told by your health care provider.  Do not take NSAIDs or medicines that contain aspirin if they make your symptoms worse.  Do not drink alcohol if it makes your symptoms worse.  Do not use any tobacco products, such as cigarettes, chewing tobacco, and e-cigarettes. If you need help quitting, ask your health care provider.  Avoid secondhand smoke.  Get some exercise every day. Exercise may help reduce symptoms of nonallergic rhinitis for some people. Ask your health care provider how much exercise and what types of exercise are safe for you.  Sleep with the head of your bed raised (elevated). This may reduce nighttime nasal congestion.  Keep all follow-up visits as told by your health care provider. This is important. Contact a health care provider if:  You have a fever.  Your symptoms are getting worse at home.  Your symptoms are not responding to  medicine.  You develop new symptoms, especially a headache or nosebleed. This information is not intended to replace advice given to you by your health care provider. Make sure you discuss any questions you have with your health care provider. Document Revised: 07/19/2017 Document Reviewed: 10/27/2015 Elsevier Patient Education  2020 [employer]  Rash, Adult A rash is a change in the color of your skin. A rash can also change the way your skin feels. There are many different conditions and factors that can cause a rash. Some rashes may disappear after a few days, but some may last for a few weeks. Common causes of rashes include: Viral infections, such as: Colds. Measles. Hand, foot, and mouth disease. Bacterial infections, such as: Scarlet fever. Impetigo. Fungal infections, such as Candida. Allergic reactions to food, medicines, or skin care products. Follow these instructions at home: The goal of treatment is to stop the itching and keep the rash from spreading. Pay attention to any changes in your symptoms. Follow these instructions to help with your condition: Medicine Take or apply over-the-counter and prescription medicines only as told by your health care provider. These may include: Corticosteroid creams to treat red or swollen skin. Anti-itch lotions. Oral allergy medicines (antihistamines). Oral corticosteroids for severe symptoms.  Skin  care Apply cool compresses to the affected areas. Do not scratch or rub your skin. Avoid covering the rash. Make sure the rash is exposed to air as much as possible. Managing itching and discomfort Avoid hot showers or baths, which can make itching worse. A cold shower may help. Try taking a bath with: Epsom salts. Follow manufacturer instructions on the packaging. You can get these at your local pharmacy or grocery store. Baking soda. Pour a small amount into the bath as told by your health care provider. Colloidal oatmeal. Follow  manufacturer instructions on the packaging. You can get this at your local pharmacy or grocery store. Try applying baking soda paste to your skin. Stir water into baking soda until it reaches a paste-like consistency. Try applying calamine lotion. This is an over-the-counter lotion that helps to relieve itchiness. Keep cool and out of the sun. Sweating and being hot can make itching worse. General instructions  Rest as needed. Drink enough fluid to keep your urine pale yellow. Wear loose-fitting clothing. Avoid scented soaps, detergents, and perfumes. Use gentle soaps, detergents, perfumes, and other cosmetic products. Avoid any substance that causes your rash. Keep a journal to help track what causes your rash. Write down: What you eat. What cosmetic products you use. What you drink. What you wear. This includes jewelry. Keep all follow-up visits as told by your health care provider. This is important. Contact a health care provider if: You sweat at night. You lose weight. You urinate more than normal. You urinate less than normal, or you notice that your urine is a darker color than usual. You feel weak. You vomit. Your skin or the whites of your eyes look yellow (jaundice). Your skin: Tingles. Is numb. Your rash: Does not go away after several days. Gets worse. You are: Unusually thirsty. More tired than normal. You have: New symptoms. Pain in your abdomen. A fever. Diarrhea. Get help right away if you: Have a fever and your symptoms suddenly get worse. Develop confusion. Have a severe headache or a stiff neck. Have severe joint pains or stiffness. Have a seizure. Develop a rash that covers all or most of your body. The rash may or may not be painful. Develop blisters that: Are on top of the rash. Grow larger or grow together. Are painful. Are inside your nose or mouth. Develop a rash that: Looks like purple pinprick-sized spots all over your body. Has a "bull's  eye" or looks like a target. Is not related to sun exposure, is red and painful, and causes your skin to peel. Summary A rash is a change in the color of your skin. Some rashes disappear after a few days, but some may last for a few weeks. The goal of treatment is to stop the itching and keep the rash from spreading. Take or apply over-the-counter and prescription medicines only as told by your health care provider. Contact a health care provider if you have new or worsening symptoms. Keep all follow-up visits as told by your health care provider. This is important. This information is not intended to replace advice given to you by your health care provider. Make sure you discuss any questions you have with your health care provider. Document Revised: 11/28/2018 Document Reviewed: 03/10/2018 Elsevier Patient Education  2020 [employer] Inc.  p in the back of the throat, behind the nose and the roof of the mouth.  A mass in the nasopharynx.  Damage to the ear caused by pressure changes (barotrauma). What  are the signs or symptoms? Symptoms of this condition include:  Ear pain.  A fever.  Decreased hearing.  A headache.  Tiredness (lethargy).  Fluid leaking from the ear.  Ringing in the ear. How is this diagnosed? This condition is diagnosed with a physical exam. During the exam your health care provider will use an instrument called an otoscope to look into your ear and check for redness, swelling, and fluid. He or she will also ask about your symptoms. Your health care provider may also order tests, such as:  A test to check the movement of the eardrum (pneumatic otoscopy). This test is done by squeezing a small amount of air into the ear.  A test that changes air pressure in the middle ear to check how well the eardrum moves and whether the eustachian tube is working (tympanogram). How is this treated? This condition usually goes away on its own within 3-5 days. But if the  condition is caused by a bacteria infection and does not go away own its own, or keeps coming back, your health care provider may:  Prescribe antibiotic medicines to treat the infection.  Prescribe or recommend medicines to control pain. Follow these instructions at home:  Take over-the-counter and prescription medicines only as told by your health care provider.  If you were prescribed an antibiotic medicine, take it as told by your health care provider. Do not stop taking the antibiotic even if you start to feel better.  Keep all follow-up visits as told by your health care provider. This is important. Contact a health care provider if:  You have bleeding from your nose.  There is a lump on your neck.  You are not getting better in 5 days.  You feel worse instead of better. Get help right away if:  You have severe pain that is not controlled with medicine.  You have swelling, redness, or pain around your ear.  You have stiffness in your neck.  A part of your face is paralyzed.  The bone behind your ear (mastoid) is tender when you touch it.  You develop a severe headache. Summary  Otitis media is redness, soreness, and swelling of the middle ear.  This condition usually goes away on its own within 3-5 days.  If the problem does not go away in 3-5 days, your health care provider may prescribe or recommend medicines to treat your symptoms.  If you were prescribed an antibiotic medicine, take it as told by your health care provider. This information is not intended to replace advice given to you by your health care provider. Make sure you discuss any questions you have with your health care provider. Document Revised: 07/19/2017 Document Reviewed: 07/27/2016 Elsevier Patient Education  2020 [employer]. How to Perform a Sinus Rinse A sinus rinse is a home treatment that is used to rinse your sinuses with a sterile mixture of salt and water (saline solution). Sinuses  are air-filled spaces in your skull behind the bones of your face and forehead that open into your nasal cavity. A sinus rinse can help to clear mucus, dirt, dust, or pollen from your nasal cavity. You may do a sinus rinse when you have a cold, a virus, nasal allergy symptoms, a sinus infection, or stuffiness in your nose or sinuses. Talk with your health care provider about whether a sinus rinse might help you. What are the risks? A sinus rinse is generally safe and effective. However, there are a few risks, which  include:  A burning sensation in your sinuses. This may happen if you do not make the saline solution as directed. Be sure to follow all directions when making the saline solution.  Nasal irritation.  Infection from contaminated water. This is rare, but possible. Do not do a sinus rinse if you have had ear or nasal surgery, ear infection, or blocked ears. Supplies needed:  Saline solution or powder.  Distilled or sterile water may be needed to mix with saline powder. ? You may use boiled and cooled tap water. Boil tap water for 5 minutes; cool until it is lukewarm. Use within 24 hours. ? Do not use regular tap water to mix with the saline solution.  Neti pot or nasal rinse bottle. These supplies release the saline solution into your nose and through your sinuses. Neti pots and nasal rinse bottles can be purchased at [facility] local pharmacy, a health food store, or online. How to perform a sinus rinse  1. Wash your hands with soap and water. 2. Wash your device according to the directions that came with the product and then dry it. 3. Use the solution that comes with your product or one that is sold separately in stores. Follow the mixing directions on the package if you need to mix with sterile or distilled water. 4. Fill the device with the amount of saline solution noted in the device instructions. 5. Stand over a sink and tilt your head sideways over the sink. 6. Place the spout  of the device in your upper nostril (the one closer to the ceiling). 7. Gently pour or squeeze the saline solution into your nasal cavity. The liquid should drain out from the lower nostril if you are not too congested. 8. While rinsing, breathe through your open mouth. 9. Gently blow your nose to clear any mucus and rinse solution. Blowing too hard may cause ear pain. 10. Repeat in your other nostril. 11. Clean and rinse your device with clean water and then air-dry it. Talk with your health care provider or pharmacist if you have questions about how to do a sinus rinse. Summary  A sinus rinse is a home treatment that is used to rinse your sinuses with a sterile mixture of salt and water (saline solution).  A sinus rinse is generally safe and effective. Follow all instructions carefully.  Before doing a sinus rinse, talk with your health care provider about whether it would be helpful for you. This information is not intended to replace advice given to you by your health care provider. Make sure you discuss any questions you have with your health care provider. Document Revised: 06/03/2017 Document Reviewed: 06/03/2017 Elsevier Patient Education  2020 [employer] Allergic Rhinitis, Adult Allergic rhinitis is an allergic reaction that affects the mucous membrane inside the nose. It causes sneezing, a runny or stuffy nose, and the feeling of mucus going down the back of the throat (postnasal drip). Allergic rhinitis can be mild to severe. There are two types of allergic rhinitis:  Seasonal. This type is also called hay fever. It happens only during certain seasons.  Perennial. This type can happen at any time of the year. What are the causes? This condition happens when the body's defense system (immune system) responds to certain harmless substances called allergens as though they were germs.  Seasonal allergic rhinitis is triggered by pollen, which can come from grasses, trees, and  weeds. Perennial allergic rhinitis may be caused by:  House dust mites.  Pet dander.  Mold spores. What are the signs or symptoms? Symptoms of this condition include:  Sneezing.  Runny or stuffy nose (nasal congestion).  Postnasal drip.  Itchy nose.  Tearing of the eyes.  Trouble sleeping.  Daytime sleepiness. How is this diagnosed? This condition may be diagnosed based on:  Your medical history.  A physical exam.  Tests to check for related conditions, such as: ? Asthma. ? Pink eye. ? Ear infection. ? Upper respiratory infection.  Tests to find out which allergens trigger your symptoms. These may include skin or blood tests. How is this treated? There is no cure for this condition, but treatment can help control symptoms. Treatment may include:  Taking medicines that block allergy symptoms, such as antihistamines. Medicine may be given as a shot, nasal spray, or pill.  Avoiding the allergen.  Desensitization. This treatment involves getting ongoing shots until your body becomes less sensitive to the allergen. This treatment may be done if other treatments do not help.  If taking medicine and avoiding the allergen does not work, new, stronger medicines may be prescribed. Follow these instructions at home:  Find out what you are allergic to. Common allergens include smoke, dust, and pollen.  Avoid the things you are allergic to. These are some things you can do to help avoid allergens: ? Replace carpet with wood, tile, or vinyl flooring. Carpet can trap dander and dust. ? Do not smoke. Do not allow smoking in your home. ? Change your heating and air conditioning filter at least once a month. ? During allergy season:  Keep windows closed as much as possible.  Plan outdoor activities when pollen counts are lowest. This is usually during the evening hours.  When coming indoors, change clothing and shower before sitting on furniture or bedding.  Take  over-the-counter and prescription medicines only as told by your health care provider.  Keep all follow-up visits as told by your health care provider. This is important. Contact a health care provider if:  You have a fever.  You develop a persistent cough.  You make whistling sounds when you breathe (you wheeze).  Your symptoms interfere with your normal daily activities. Get help right away if:  You have shortness of breath. Summary  This condition can be managed by taking medicines as directed and avoiding allergens.  Contact your health care provider if you develop a persistent cough or fever.  During allergy season, keep windows closed as much as possible. This information is not intended to replace advice given to you by your health care provider. Make sure you discuss any questions you have with your health care provider. Document Revised: 07/19/2017 Document Reviewed: 09/13/2016 Elsevier Patient Education  [employer]. Eustachian Tube Dysfunction  Eustachian tube dysfunction refers to a condition in which a blockage develops in the narrow passage that connects the middle ear to the back of the nose (eustachian tube). The eustachian tube regulates air pressure in the middle ear by letting air move between the ear and nose. It also helps to drain fluid from the middle ear space. Eustachian tube dysfunction can affect one or both ears. When the eustachian tube does not function properly, air pressure, fluid, or both can build up in the middle ear. What are the causes? This condition occurs when the eustachian tube becomes blocked or cannot open normally. Common causes of this condition include:  Ear infections.  Colds and other infections that affect the nose, mouth, and throat (upper respiratory  tract).  Allergies.  Irritation from cigarette smoke.  Irritation from stomach acid coming up into the esophagus (gastroesophageal reflux). The esophagus is the tube that  carries food from the mouth to the stomach.  Sudden changes in air pressure, such as from descending in an airplane or scuba diving.  Abnormal growths in the nose or throat, such as: ? Growths that line the nose (nasal polyps). ? Abnormal growth of cells (tumors). ? Enlarged tissue at the back of the throat (adenoids). What increases the risk? You are more likely to develop this condition if:  You smoke.  You are overweight.  You are a child who has: ? Certain birth defects of the mouth, such as cleft palate. ? Large tonsils or adenoids. What are the signs or symptoms? Common symptoms of this condition include:  A feeling of fullness in the ear.  Ear pain.  Clicking or popping noises in the ear.  Ringing in the ear.  Hearing loss.  Loss of balance.  Dizziness. Symptoms may get worse when the air pressure around you changes, such as when you travel to an area of high elevation, fly on an airplane, or go scuba diving. How is this diagnosed? This condition may be diagnosed based on:  Your symptoms.  A physical exam of your ears, nose, and throat.  Tests, such as those that measure: ? The movement of your eardrum (tympanogram). ? Your hearing (audiometry). How is this treated? Treatment depends on the cause and severity of your condition.  In mild cases, you may relieve your symptoms by moving air into your ears. This is called "popping the ears."  In more severe cases, or if you have symptoms of fluid in your ears, treatment may include: ? Medicines to relieve congestion (decongestants). ? Medicines that treat allergies (antihistamines). ? Nasal sprays or ear drops that contain medicines that reduce swelling (steroids). ? A procedure to drain the fluid in your eardrum (myringotomy). In this procedure, a small tube is placed in the eardrum to:  Drain the fluid.  Restore the air in the middle ear space. ? A procedure to insert a balloon device through the nose to  inflate the opening of the eustachian tube (balloon dilation). Follow these instructions at home: Lifestyle  Do not do any of the following until your health care provider approves: ? Travel to high altitudes. ? Fly in airplanes. ? Work in a Estate agent or room. ? Scuba dive.  Do not use any products that contain nicotine or tobacco, such as cigarettes and e-cigarettes. If you need help quitting, ask your health care provider.  Keep your ears dry. Wear fitted earplugs during showering and bathing. Dry your ears completely after. General instructions  Take over-the-counter and prescription medicines only as told by your health care provider.  Use techniques to help pop your ears as recommended by your health care provider. These may include: ? Chewing gum. ? Yawning. ? Frequent, forceful swallowing. ? Closing your mouth, holding your nose closed, and gently blowing as if you are trying to blow air out of your nose.  Keep all follow-up visits as told by your health care provider. This is important. Contact a health care provider if:  Your symptoms do not go away after treatment.  Your symptoms come back after treatment.  You are unable to pop your ears.  You have: ? A fever. ? Pain in your ear. ? Pain in your head or neck. ? Fluid draining from your ear.  Your hearing suddenly changes.  You become very dizzy.  You lose your balance. Summary  Eustachian tube dysfunction refers to a condition in which a blockage develops in the eustachian tube.  It can be caused by ear infections, allergies, inhaled irritants, or abnormal growths in the nose or throat.  Symptoms include ear pain, hearing loss, or ringing in the ears.  Mild cases are treated with maneuvers to unblock the ears, such as yawning or ear popping.  Severe cases are treated with medicines. Surgery may also be done (rare). This information is not intended to replace advice given to you by your health  care provider. Make sure you discuss any questions you have with your health care provider. Document Revised: 11/26/2017 Document Reviewed: 11/26/2017 Elsevier Patient Education  2020 [employer].

## 2020-01-12 NOTE — Progress Notes (Signed)
Subjective:    Patient ID: Tara Chapman, female    DOB: Jan 02, 1996, 24 y.o.   MRN: [id number]  23y/o new to clinic african female pt presents using Google translate to report pain in her L ear that goes down into her L neck and gives her intermittent headache. [employer] utilized for remainder of clinic visit. Interpretor number [id number] [place] Arabic.  Patient had noted last month pimples and pain on cheek in front of right ear that resolved and now having same symptoms left ear.  She believes pimples are infectious and spread from one ear to the other.  Patient work restrictions require mask wear at work  Has noticed cold symptoms runny nose, congestion, sore throat in the past month.  Denied fever/chills, body aches, n/v/d, dysphagia/dysphasia.  Typically uses motrin/ibuprofen po prn pain but hasn't taken any yet today.     Review of Systems  Constitutional: Negative for activity change, appetite change, chills, diaphoresis, fatigue and fever.  HENT: Positive for congestion, ear pain, postnasal drip, rhinorrhea and sore throat. Negative for ear discharge, facial swelling, hearing loss, mouth sores, nosebleeds, trouble swallowing and voice change.   Eyes: Negative for photophobia, pain, discharge, redness, itching and visual disturbance.  Respiratory: Negative for cough, shortness of breath, wheezing and stridor.   Gastrointestinal: Negative for diarrhea, nausea and vomiting.  Endocrine: Negative for cold intolerance and heat intolerance.  Genitourinary: Negative for difficulty urinating.  Musculoskeletal: Negative for gait problem, neck pain and neck stiffness.  Skin: Positive for rash.  Allergic/Immunologic: Negative for environmental allergies and food allergies.  Neurological: Negative for dizziness, seizures, syncope, facial asymmetry, speech difficulty, light-headedness, numbness and headaches.  Hematological: Negative for adenopathy. Does not  bruise/bleed easily.  Psychiatric/Behavioral: Negative for agitation, confusion and sleep disturbance.       Objective:   Physical Exam Vitals and nursing note reviewed.  Constitutional:      General: She is awake. She is not in acute distress.    Appearance: Normal appearance. She is well-developed, well-groomed and normal weight. She is not ill-appearing, toxic-appearing or diaphoretic.  HENT:     Head: Normocephalic and atraumatic. No raccoon eyes, Battle's sign, abrasion, contusion, masses, right periorbital erythema, left periorbital erythema or laceration. Hair is normal.     Jaw: There is normal jaw occlusion. No trismus, tenderness, swelling, pain on movement or malocclusion.     Salivary Glands: Right salivary gland is not diffusely enlarged or tender. Left salivary gland is not diffusely enlarged or tender.     Right Ear: Hearing, ear canal and external ear normal. No middle ear effusion. There is impacted cerumen.     Left Ear: Hearing, ear canal and external ear normal. A middle ear effusion is present. There is impacted cerumen. Tympanic membrane is bulging.     Nose: Mucosal edema and rhinorrhea present. No nasal deformity, septal deviation, signs of injury, laceration, nasal tenderness or congestion.     Right Nostril: No epistaxis or occlusion.     Left Nostril: No epistaxis or occlusion.     Right Sinus: No maxillary sinus tenderness or frontal sinus tenderness.     Left Sinus: No maxillary sinus tenderness or frontal sinus tenderness.     Mouth/Throat:     Lips: Pink. No lesions.     Mouth: Mucous membranes are moist. Mucous membranes are not pale, not dry and not cyanotic. No injury, lacerations, oral lesions or angioedema.     Dentition: No gingival swelling, dental abscesses or  gum lesions.     Tongue: No lesions. Tongue does not deviate from midline.     Palate: No mass and lesions.     Pharynx: Uvula midline. Pharyngeal swelling and posterior oropharyngeal erythema  present. No oropharyngeal exudate or uvula swelling.     Tonsils: No tonsillar exudate or tonsillar abscesses. 1+ on the right. 1+ on the left.     Comments: Cobblestoning posterior pharynx; Bilateraly auditory canals with gold wax occluding TM; bilateral allergic shiners; nasal sniffing noted and congestion in exam room Eyes:     General: Lids are normal. Vision grossly intact. Gaze aligned appropriately. Allergic shiner present. No visual field deficit or scleral icterus.       Right eye: No foreign body, discharge or hordeolum.        Left eye: No foreign body, discharge or hordeolum.     Extraocular Movements:     Right eye: Normal extraocular motion and no nystagmus.     Left eye: Normal extraocular motion and no nystagmus.     Conjunctiva/sclera: Conjunctivae normal.     Right eye: Right conjunctiva is not injected. No chemosis, exudate or hemorrhage.    Left eye: Left conjunctiva is not injected. No chemosis, exudate or hemorrhage.    Pupils: Pupils are equal, round, and reactive to light. Pupils are equal.     Right eye: Pupil is round and reactive.     Left eye: Pupil is round and reactive.  Neck:     Thyroid: No thyroid mass, thyromegaly or thyroid tenderness.     Trachea: Trachea and phonation normal. No tracheal tenderness or tracheal deviation.  Cardiovascular:     Rate and Rhythm: Normal rate and regular rhythm.     Chest Wall: PMI is not displaced.     Pulses: Normal pulses.          Radial pulses are 2+ on the right side and 2+ on the left side.     Heart sounds: Normal heart sounds, S1 normal and S2 normal. No murmur. No friction rub. No gallop.   Pulmonary:     Effort: Pulmonary effort is normal. No accessory muscle usage or respiratory distress.     Breath sounds: Normal breath sounds and air entry. No stridor, decreased air movement or transmitted upper airway sounds. No decreased breath sounds, wheezing, rhonchi or rales.     Comments: Wearing cloth mask due to covid  19 pandemic; spoke full sentences without difficulty; no cough observed in exam room Chest:     Chest wall: No tenderness.  Abdominal:     General: Abdomen is flat. There is no distension.     Palpations: Abdomen is soft.  Musculoskeletal:        General: No swelling, tenderness, deformity or signs of injury. Normal range of motion.     Right shoulder: Normal.     Left shoulder: Normal.     Right elbow: Normal.     Left elbow: Normal.     Right hand: Normal.     Left hand: Normal.     Cervical back: Normal, normal range of motion and neck supple. No swelling, edema, deformity, erythema, signs of trauma, lacerations, rigidity, spasms, torticollis, tenderness or crepitus. No pain with movement or muscular tenderness. Normal range of motion.     Thoracic back: Normal.     Lumbar back: Normal.     Right hip: Normal.     Left hip: Normal.     Right knee: Normal.  Left knee: Normal.     Right lower leg: No edema.     Left lower leg: No edema.  Lymphadenopathy:     Head:     Right side of head: No submental, submandibular, tonsillar, preauricular, posterior auricular or occipital adenopathy.     Left side of head: No submental, submandibular, tonsillar, preauricular, posterior auricular or occipital adenopathy.     Cervical: No cervical adenopathy.     Right cervical: No superficial, deep or posterior cervical adenopathy.    Left cervical: No superficial, deep or posterior cervical adenopathy.     Comments: Preauricular lymph node left slightly TTP but no palpable enlargement or visually enlarged compared to right  Skin:    General: Skin is warm and dry.     Capillary Refill: Capillary refill takes less than 2 seconds.     Coloration: Skin is not ashen, cyanotic, jaundiced, mottled, pale or sallow.     Findings: Rash present. No abrasion, abscess, acne, bruising, burn, ecchymosis, erythema, signs of injury, laceration, lesion, petechiae or wound. Rash is papular. Rash is not crusting,  macular, nodular, purpuric, pustular, scaling, urticarial or vesicular.     Nails: There is no clubbing.          Comments: 3 pin head sized papules preauricular left dry not abraded slightly TTP no erythema  Neurological:     General: No focal deficit present.     Mental Status: She is alert and oriented to person, place, and time. Mental status is at baseline. She is not disoriented.     GCS: GCS eye subscore is 4. GCS verbal subscore is 5. GCS motor subscore is 6.     Cranial Nerves: Cranial nerves are intact. No cranial nerve deficit, dysarthria or facial asymmetry.     Sensory: Sensation is intact. No sensory deficit.     Motor: Motor function is intact. No weakness, tremor, atrophy, abnormal muscle tone or seizure activity.     Coordination: Coordination is intact. Coordination normal.     Gait: Gait is intact. Gait normal.     Comments: Gait sure and steady in clinic; on/off exam table and in/out of chair without difficulty; bilateral hand grasp equal 5/5  Psychiatric:        Attention and Perception: Attention and perception normal.        Mood and Affect: Mood and affect normal.        Speech: Speech normal.        Behavior: Behavior normal. Behavior is cooperative.        Thought Content: Thought content normal.        Cognition and Memory: Cognition and memory normal.        Judgment: Judgment normal.     Cerumen impaction left auditory canal occluded 100% canal and visualization of TM; cerumen gold soft; utilized water irrigation with 56ml syringe and curettage x3 to remove 75% cerumen from auditory canal; able to visualize 100% left TM after procedure and patient denied any worsening ear pain TM intact without bleeding noted  Bulging air fluid level clear left TM  Patient denied dizzyness,n/v.  Procedure stopped due to patient discomfort.      Assessment & Plan:  A-dermatitis acute initial visit, cerumen impaction bilateral initial visit, acute rhinitis, left ear pain  initial visit  P-Patient reported slight discomfort external ear canal after procedure curretage and syringe water cerumen extraction left ear by NP Cabot Crom[place]ie and TM intact without erythema.  When in clinic have provider check  ears to see if buildup with this method again.  Discussed purpose of earwax with patient.  Avoid cotton applicator (Q-tip) use in ears.  exitcare handout on cerumen impaction printed and given to patient  Patient verbalized understanding, agreed with plan of care and had no further questions at this time.       No evidence of invasive bacterial infection, non toxic and well hydrated.  I do not see where any further testing or imaging is necessary at this time.   I will suggest supportive care, rest, good hygiene and encourage the patient to take adequate fluids.  The patient is to return to clinic or EMERGENCY ROOM if symptoms worsen or change significantly e.g. ear pain, fever, purulent discharge from ears or bleeding.  Exitcare handout on otitis media and eustachian tube dysfunction printed and given to patient.  Discussed with patient post nasal drip irritates throat/causes swelling blocks eustachian tubes from draining and fluid fills up middle ear.  Bacteria/viruses can grow in fluid and with moving head tube compressed and increases pressure in tube/ear worsening pain especially with chewing/talking/rotation, lateral bending and/or flexion of neck.  Studies show will take 30 days for fluid to resolve after post nasal drip controlled with nasal steroid/antihistamine. Antibiotics and steroids do not speed up fluid removal. Ibuprofen 800mg  po TID prn pain take with food  Patient given 4 UD 400mg  ibuprofen from clinic stock and she reported she has motrin at home. Patient verbalized agreement and understanding of treatment plan and had no further questions at this time.  Patient may use normal saline nasal spray 2 sprays each nostril q2h wa as needed.  OTC antihistamine of choice  claritin/zyrtec 10mg  po daily.  Given 3 UD from clinic stock loratadine 10mg  po daily and phenylephrine 5mg  po q6h prn rhinitis if no relief with nasal saline and loratadine.  Avoid triggers if possible.  Shower prior to bedtime if exposed to triggers.  If allergic dust/dust mites recommend mattress/pillow covers/encasements; washing linens, vacuuming, sweeping, dusting weekly.  Call or return to clinic as needed if these symptoms worsen or fail to improve as anticipated.   Exitcare handout on allergic rhinitis, nonallergic rhinitis and sinus rinse given to patient.  Patient verbalized understanding of instructions, agreed with plan of care and had no further questions at this time.  P2:  Avoidance and hand washing.  Discussed papules in line with mask straps probable contact irritation from mask not infection. Apply emollient twice a day e.g. Fragrance free vaseline or aquaphor given 3 UD from clinic stock  May apply ice/cold compress 5 minutes QID prn itching/swelling.  Avoid rubbing face at work.  Could have been exposed to cleaners/chemicals/allergens/irirtants that transferred from items to hands then her body/clothing.  Shower when she finishes work/prior to bedtime.  Avoid harsh/abrasive soaps use fragrance free/sensitive like dove/cetaphil.    Medication as directed. Call or return to clinic as needed if these symptoms worsen or fail to improve as anticipated. Exitcare handout on adult rash printed and given to patient  Follow up for re-evaluation in 48 hours if no improvement and/or worsening of rash with plan of care. Patient verbalized agreement and understanding of treatment plan and had no further questions at this time

## 2020-01-14 NOTE — Progress Notes (Signed)
This encounter was created in error - please disregard.

## 2020-01-28 ENCOUNTER — Other Ambulatory Visit: Payer: Self-pay

## 2020-01-28 ENCOUNTER — Ambulatory Visit: Payer: Self-pay | Admitting: Registered Nurse

## 2020-01-28 ENCOUNTER — Encounter: Payer: Self-pay | Admitting: Registered Nurse

## 2020-01-28 VITALS — BP 100/72 | HR 78 | Temp 99.2°F

## 2020-01-28 DIAGNOSIS — M659 Synovitis and tenosynovitis, unspecified: Secondary | ICD-10-CM

## 2020-01-28 MED ORDER — IBUPROFEN 800 MG PO TABS: 800.0000 mg | ORAL_TABLET | Freq: Three times a day (TID) | ORAL | 0 refills | Status: DC

## 2020-01-28 NOTE — Progress Notes (Signed)
Subjective:    Patient ID: Tara Chapman, female    DOB: 18-Oct-1995, 24 y.o.   MRN: [id number]  23y/o [place] female pt established c/o R hand and wrist pain. 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 Endorses pain over 1st MCP and at times extends down into anterior wrist. Some pain in left hand as well but right hand is worse. Denied trauma just getting worse over time.  Has been performing workcenter exercises.  Has been taking motrin 200mg  po prn without much relief.  Patient reported ear pain from May has resolved.     Review of Systems  Constitutional: Positive for activity change. Negative for appetite change, chills, diaphoresis, fatigue and fever.  HENT: Negative for trouble swallowing and voice change.   Eyes: Negative for photophobia and visual disturbance.  Respiratory: Negative for cough, shortness of breath, wheezing and stridor.   Cardiovascular: Negative for palpitations.  Gastrointestinal: Negative for diarrhea, nausea and vomiting.  Endocrine: Negative for cold intolerance and heat intolerance.  Genitourinary: Negative for difficulty urinating.  Musculoskeletal: Positive for arthralgias, joint swelling and myalgias. Negative for back pain, gait problem, neck pain and neck stiffness.  Skin: Negative for color change, pallor, rash and wound.  Allergic/Immunologic: Negative for environmental allergies and food allergies.  Neurological: Negative for dizziness, tremors, seizures, syncope, facial asymmetry, speech difficulty, weakness, light-headedness, numbness and headaches.  Hematological: Negative for adenopathy. Does not bruise/bleed easily.  Psychiatric/Behavioral: Negative for agitation, confusion and sleep disturbance.       Objective:   Physical Exam Constitutional:      General: She  is awake. She is not in acute distress.    Appearance: Normal appearance. She is well-developed and well-groomed. She is not ill-appearing, toxic-appearing or diaphoretic.  HENT:     Head: Normocephalic and atraumatic.     Jaw: There is normal jaw occlusion.     Right Ear: Hearing and external ear normal.     Left Ear: Hearing and external ear normal.     Nose: Nose normal.     Mouth/Throat:     Mouth: No angioedema.     Pharynx: Oropharynx is clear.  Eyes:     General: Lids are normal. Vision grossly intact. Gaze aligned appropriately. Allergic shiner present. No visual field deficit or scleral icterus.       Right eye: No discharge.        Left eye: No discharge.     Extraocular Movements: Extraocular movements intact.     Conjunctiva/sclera: Conjunctivae normal.     Pupils: Pupils are equal, round, and reactive to light.  Neck:     Thyroid: No thyromegaly.     Trachea: Trachea and phonation normal.  Cardiovascular:     Rate and Rhythm: Normal rate and regular rhythm.     Pulses: Normal pulses.          Radial pulses are 2+ on the right side and 2+ on the left side.  Pulmonary:     Effort: Pulmonary effort is normal.     Breath sounds: Normal breath sounds and air entry. No stridor, decreased air movement or transmitted upper airway sounds. No decreased breath sounds, wheezing, rhonchi or rales.     Comments: No cough observed in exam room; spoke full sentences without difficulty; wearing cloth mask due to covid 19 pandemic Abdominal:  General: Abdomen is flat.     Palpations: Abdomen is soft.  Musculoskeletal:        General: Tenderness present. No swelling or deformity.     Right shoulder: Normal.     Left shoulder: Normal.     Right elbow: Normal.     Left elbow: Normal.     Right forearm: Tenderness present. No swelling, edema, deformity, lacerations or bony tenderness.     Left forearm: Normal. No swelling, edema, deformity, lacerations, tenderness or bony tenderness.      Right wrist: Tenderness, bony tenderness and snuff box tenderness present. No swelling, deformity, effusion, lacerations or crepitus. Decreased range of motion. Normal pulse.     Left wrist: Tenderness present. No swelling, deformity, effusion, lacerations, bony tenderness, snuff box tenderness or crepitus. Normal range of motion. Normal pulse.     Right hand: Tenderness and bony tenderness present. No swelling, deformity or lacerations. Normal range of motion. Normal strength. Normal sensation. There is no disruption of two-point discrimination. Normal capillary refill. Normal pulse.     Left hand: Tenderness present. No swelling, deformity, lacerations or bony tenderness. Normal range of motion. Normal strength. Normal sensation. There is no disruption of two-point discrimination. Normal capillary refill. Normal pulse.       Arms:     Cervical back: Normal, normal range of motion and neck supple. No swelling, edema, deformity, erythema, signs of trauma, lacerations, rigidity, torticollis or crepitus. No pain with movement. Normal range of motion.     Thoracic back: Normal.     Lumbar back: Normal.     Right hip: Normal.     Left hip: Normal.     Right knee: Normal.     Left knee: Normal.     Comments: Most tender to palpation base of thumb and distal ulna/radius/snuff box and medially at MCP between digits 2-3 right  Lymphadenopathy:     Head:     Right side of head: No submental, submandibular, tonsillar or preauricular adenopathy.     Left side of head: No submental, submandibular, tonsillar or preauricular adenopathy.     Cervical: No cervical adenopathy.     Right cervical: No superficial cervical adenopathy.    Left cervical: No superficial cervical adenopathy.  Skin:    General: Skin is warm and dry.     Capillary Refill: Capillary refill takes less than 2 seconds.     Coloration: Skin is not ashen, cyanotic, jaundiced, mottled, pale or sallow.     Findings: No abrasion,  abscess, acne, bruising, burn, ecchymosis, erythema, signs of injury, laceration, lesion, petechiae, rash or wound. Rash is not crusting, macular, nodular, papular, purpuric, pustular, scaling, urticarial or vesicular.     Nails: There is no clubbing.       Neurological:     General: No focal deficit present.     Mental Status: She is alert and oriented to person, place, and time. Mental status is at baseline.     GCS: GCS eye subscore is 4. GCS verbal subscore is 5. GCS motor subscore is 6.     Cranial Nerves: Cranial nerves are intact. No cranial nerve deficit, dysarthria or facial asymmetry.     Sensory: Sensation is intact. No sensory deficit.     Motor: Motor function is intact. No weakness, tremor, atrophy, abnormal muscle tone or seizure activity.     Coordination: Coordination is intact. Coordination normal.     Gait: Gait is intact. Gait normal.     Comments: Gait  sure and steady in hallway; in/out of chair and on/off exam table without difficulty; bilateral hand grasp 5/5 equal  Psychiatric:        Attention and Perception: Attention and perception normal.        Mood and Affect: Mood and affect normal.        Speech: Speech normal.        Behavior: Behavior normal. Behavior is cooperative.        Thought Content: Thought content normal.        Cognition and Memory: Cognition and memory normal.        Judgment: Judgment normal.           Assessment & Plan:  A-right hand tenosynovitis initial visit  P-Attempted to connect with sudanese translator x 2 and on hold 15 minutes then call disconnected by interpretor services.  Discussed with patient I had meeting scheduled but would attempt to connect with translator again prior to end of her shift after my meeting ended to go over discharge instructions again with interpretor translating.  Reviewed instructions in English with patient demonstrating stretches, fitted and distributed medium wrist with thumb spica from clinic stock  to patient along with reusable ice pack and cover.  Patient reported she had ibuprofen 200mg  at home and she could take 800mg  po TID prn pain take ibuprofen with food take at least once a day x 2 weeks.  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  Discussed with her [facility] closed for lunch/meeting and reopens at 1300 will call to get her appt with provider for work restrictions and notify her of date/time once scheduled by RN .  Discussed may hand wash splint and air dry or use blowdryer but do not place in drying machine for clothes as can melt plastic.  Wear at work and at home if performing housework does not need to wear while sleeping or resting.  If hand throbbing consider elevating on pillow and applying ice 15 minutes QID prn pain/swelling.  Trial  biofreeze gel apply QID prn pain.  Lift fewer plates/items at one time to decrease weight.  Exitcare handouts on dequervains tenosynovitis and rehab exercises printed and given to patient. Medications as directed. No other nsaids while taken ibuprofen e.g. aleve/advil/naproxen/naprosyn/motrin. Suspect overuse soft tissue injury related pain due to repetitive motion working in packing and new to position. Call or return to clinic as needed if these symptoms worsen  or fail to improve as anticipated  Next follow up evaluation to be scheduled by RN [facility] per [facility] provider instructions.  Discussed tendon inflamed and NSAID/ice help to improve.  Avoid starting new activities that utilize hands in the next two weeks and then only 15 minutes per day after that time of rest.  Due to contract limitations I am not able to write for work restrictions.  Patient verbalized agreement and understanding of treatment plan and had no further questions at this time. P2: ROM, injury prevention

## 2020-01-28 NOTE — Patient Instructions (Signed)
Proximal Biceps Tendinitis and Tenosynovitis Rehab Ask your health care provider which exercises are safe for you. Do exercises exactly as told by your health care provider and adjust them as directed. It is normal to feel mild stretching, pulling, tightness, or discomfort as you do these exercises. Stop right away if you feel sudden pain or your pain gets worse. Do not begin these exercises until told by your health care provider. Stretching and range-of-motion exercises These exercises warm up your muscles and joints and improve the movement and flexibility of your arm and shoulder. The exercises also help to relieve pain and stiffness. Forearm rotation 1. Stand or sit with your left / right elbow bent in a 90-degree (right angle). Position your forearm so that the thumb is facing the ceiling (neutral position). 2. Rotate your palm up until it cannot go any farther. 3. Hold this position for ___5-15_______ seconds. 4. Rotate your palm down until it cannot go any farther. 5. Hold this position for _______5-15___ seconds. Repeat ___3______ times. Complete this exercise ____3______ times a day. Elbow range of motion 1. Stand or sit with your left / right elbow bent in a 90-degree angle (right angle). Position your forearm so that the thumb is facing the ceiling (neutral position). 2. Slowly bend your elbow as far as you can until you feel a stretch or cannot go any farther. 3. Hold this position for ___5-15_______ seconds. 4. Slowly straighten your elbow as far as you can until you feel a stretch or cannot go any farther. 5. Hold this position for _____5-15_____ seconds. Repeat _____3_____ times. Complete this exercise ____3______ times a day. Biceps stretch 1. Stand facing a wall, or stand by a door frame. 2. Raise your left / right arm out to your side, to your shoulder height. Place the thumb side of your hand against the wall. Your palm should be facing the floor (palm down). 3. Keeping your  arm straight, rotate your body in the opposite direction of the raised arm until you feel a gentle stretch in your biceps. 4. Hold this position for ___5-15_______ seconds. 5. Slowly return to the starting position. Repeat _____3_____ times. Complete this exercise ____3______ times a day. Shoulder pendulum  1. Stand near a table or counter that you can hold onto for balance. 2. Bend forward at the waist and let your left / right arm hang straight down. Use your other arm to support you and help you stay balanced. 3. Relax your left / right arm and shoulder muscles, and move your hips and your trunk so your left / right arm swings freely. Your arm should swing because of the motion of your body, not because you are using your arm or shoulder muscles. 4. Keep moving your hips and trunk so your arm swings in the following directions, as told by your health care provider: ? Side to side. ? Forward and backward. ? In clockwise and counterclockwise circles. Repeat __5-15________ times. Complete this exercise ____3______ times a day. Shoulder flexion, assisted  1. Stand facing a wall. Put your left / right palm on the wall. 2. Slowly move your left / right hand up the wall (flexion). Stop when you feel a stretch in your shoulder, or when you reach the angle that is recommended by your health care provider. ? Use your other hand to help raise your arm, if needed (assisted). ? As your hand gets higher, you may need to step closer to the wall. ? Avoid shrugging or lifting  your shoulder up as you raise your arm. To do this, keep your shoulder blade tucked down toward your spine. 3. Hold this position for ___5-15_______ seconds. 4. Slowly return to the starting position. Use your other arm to help, if needed. Repeat ____3______ times. Complete this exercise ____3______ times a day. Shoulder flexion 1. Stand with your left / right arm hanging down at your side. 2. Keep your arm straight as you lift your  arm forward and toward the ceiling (flexion). 3. Hold this position for ___5-15_______ seconds. 4. Slowly return to the starting position. Repeat _____3_____ times. Complete this exercise ___3_______ times a day. Sleeper stretch, assisted 1. Lie on your left / right side (injured side) with your hips and knees bent and your left / right arm straight in front of you. 2. Bend your elbow to a 90-degree angle (right angle), so your fingers are pointing to the ceiling. 3. Use your other hand to gently push your arm toward the floor (assisted), stopping when you feel a gentle stretch. ? Keep your shoulder blades lightly squeezed together during the exercise. 4. Hold this position for ___5-15_______ seconds. 5. Slowly return to the starting position. Repeat ____3______ times. Complete this exercise _____3_____ times a day. Strengthening exercises These exercises build strength and endurance in your arm and shoulder. Endurance is the ability to use your muscles for a long time, even after they get tired. Biceps curls You can use a weight or an exercise band for this exercise. 1. Sit on a stable chair without armrests, or stand up. 2. Hold a ____none______ weight in your left / right hand, or hold an exercise band with both hands. Your palms should face up toward the ceiling at the starting position. 3. Bend your left / right elbow and move your hand up toward your shoulder. Keep your other arm straight down, in the starting position. 4. Hold this position for _____5-15_____ seconds. 5. Slowly return to the starting position. Repeat _____3_____ times. Complete this exercise ______3____ times a day. Internal shoulder rotation You will use an exercise band secured to a stable object at waist height for this exercise. A door and doorframe work well. 1. Stand sideways next to a door with your left / right arm closest to the door, holding the exercise band in your hand. 2. With your elbow bent in a  90-degree angle (right angle) and keeping your elbow at your side, bring your hand toward your belly (internal rotation). ? Make sure your wrist is staying straight as you do this exercise. 3. Hold this position for __5-15________ seconds. 4. Slowly return to the starting position. Repeat ____3______ times. Complete this exercise ____3______ times a day. External shoulder rotation You will use an exercise band secured to a stable object at waist height for this exercise. A door and doorframe work well. 1. Stand sideways next to a door with your left / right arm away from the door, holding the exercise band in your hand. 2. With your elbow bent in a 90-degree angle (right angle) and keeping your elbow at your side, swing your arm away from your body (external rotation). ? Make sure your wrist is staying straight as you do this exercise. 3. Hold this position for ___5-15_______ seconds. 4. Slowly return to the starting position. Repeat ____3______ times. Complete this exercise ____3______ times a day. External shoulder rotation, side-lying You will use a weight to do this exercise. 1. Lie on your uninjured side with your left / right arm  at your side. Bend your elbow to a 90-degree angle (right angle). Hold a ___none______ weight in your left / right hand. 2. Keeping your elbow at your side, raise your arm toward the ceiling (external rotation). ? Make sure your wrist is staying straight as you do this exercise. 3. Hold this position for ____5-15______ seconds. 4. Slowly return to the starting position. Repeat ____3______ times. Complete this exercise ____3______ times a day. Scapular retraction Scapular retraction is the process of pulling the shoulder blades (scapulae) toward each other, and toward the spine. You will need an exercise band to do this exercise. 1. Sit in a stable chair without armrests, or stand up. 2. Secure an exercise band to a stable object in front of you so the band is  at shoulder height. 3. Hold one end of the exercise band in each hand. 4. Squeeze your shoulder blades together and move your elbows slightly behind you (retraction). Do not shrug your shoulders upward while you do this. 5. Hold this position for __5-15________ seconds. 6. Slowly return to the starting position. Repeat _____3_____ times. Complete this exercise ___3_______ times a day. Scapular protraction, supine Scapular protraction is the process of moving your shoulder blades away from each other, and away from the spine, while you lie on your back (supine position). 1. Lie on your back on a firm surface. Hold a ____none______ weight in your left / right hand. 2. Raise your left / right arm straight into the air so your hand is directly above your shoulder joint. 3. Push the weight into the air so your shoulder (scapula) lifts off the surface that you are lying on. Think of trying to punch the ceiling by only moving your scapula forward (protraction). Do not move your head, neck, or back. 4. Hold this position for __5-15________ seconds. 5. Slowly return to the starting position. Repeat _____3_____ times. Complete this exercise _____3_____ times a day. This information is not intended to replace advice given to you by your health care provider. Make sure you discuss any questions you have with your health care provider. Document Revised: 12/02/2018 Document Reviewed: 08/18/2018 Elsevier Patient Education  2020 [employer] Tommi Rumps Quervain's Tenosynovitis  De Quervain's tenosynovitis is a condition that causes inflammation of the tendon on the thumb side of the wrist. Tendons are cords of tissue that connect bones to muscles. The tendons in the hand pass through a tunnel called a sheath. A slippery layer of tissue (synovium) lets the tendons move smoothly in the sheath. With de Quervain's tenosynovitis, the sheath swells or thickens, causing friction and pain. The condition is also called de  Quervain's disease and de Quervain's syndrome. It occurs most often in women who are 23-74 years old. What are the causes? The exact cause of this condition is not known. It may be associated with overuse of the hand and wrist. What increases the risk? You are more likely to develop this condition if you:  Use your hands far more than normal, especially if you repeat certain movements that involve twisting your hand or using a tight grip.  Are pregnant.  Are a middle-aged woman.  Have rheumatoid arthritis.  Have diabetes. What are the signs or symptoms? The main symptom of this condition is pain on the thumb side of the wrist. The pain may get worse when you grasp something or turn your wrist. Other symptoms may include:  Pain that extends up the forearm.  Swelling of your wrist and hand.  Trouble moving  the thumb and wrist.  A sensation of snapping in the wrist.  A bump filled with fluid (cyst) in the area of the pain. How is this diagnosed? This condition may be diagnosed based on:  Your symptoms and medical history.  A physical exam. During the exam, your health care provider may do a simple test Lourena Simmonds test) that involves pulling your thumb and wrist to see if this causes pain. You may also need to have an X-ray. How is this treated? Treatment for this condition may include:  Avoiding any activity that causes pain and swelling.  Taking medicines. Anti-inflammatory medicines and corticosteroid injections may be used to reduce inflammation and relieve pain.  Wearing a splint.  Having surgery. This may be needed if other treatments do not work. Once the pain and swelling has gone down:  Physical therapy. This includes stretching and strengthening exercises.  Occupational therapy. This includes adjusting how you move your wrist. Follow these instructions at home: If you have a splint:  Wear the splint as told by your health care provider. Remove it only as  told by your health care provider.  Loosen the splint if your fingers tingle, become numb, or turn cold and blue.  Keep the splint clean.  If the splint is not waterproof: ? Do not let it get wet. ? Cover it with a watertight covering when you take a bath or a shower. Managing pain, stiffness, and swelling   Avoid movements and activities that cause pain and swelling in the wrist area.  If directed, put ice on the painful area. This may be helpful after doing activities that involve the sore wrist. ? Put ice in a plastic bag. ? Place a towel between your skin and the bag. ? Leave the ice on for 20 minutes, 2-3 times a day.  Move your fingers often to avoid stiffness and to lessen swelling.  Raise (elevate) the injured area above the level of your heart while you are sitting or lying down. General instructions  Return to your normal activities as told by your health care provider. Ask your health care provider what activities are safe for you.  Take over-the-counter and prescription medicines only as told by your health care provider.  Keep all follow-up visits as told by your health care provider. This is important. Contact a health care provider if:  Your pain medicine does not help.  Your pain gets worse.  You develop new symptoms. Summary  De Quervain's tenosynovitis is a condition that causes inflammation of the tendon on the thumb side of the wrist.  The condition occurs most often in women who are 48-35 years old.  The exact cause of this condition is not known. It may be associated with overuse of the hand and wrist.  Treatment starts with avoiding activity that causes pain or swelling in the wrist area. Other treatment may include wearing a splint and taking medicine. Sometimes, surgery is needed. This information is not intended to replace advice given to you by your health care provider. Make sure you discuss any questions you have with your health care  provider. Document Revised: 02/06/2018 Document Reviewed: 07/15/2017 Elsevier Patient Education  2020 [employer].

## 2020-03-03 ENCOUNTER — Other Ambulatory Visit: Payer: Self-pay

## 2020-03-03 ENCOUNTER — Encounter: Payer: Self-pay | Admitting: Family Medicine

## 2020-03-03 ENCOUNTER — Ambulatory Visit (INDEPENDENT_AMBULATORY_CARE_PROVIDER_SITE_OTHER): Payer: 59 | Admitting: Family Medicine

## 2020-03-03 VITALS — BP 112/77 | HR 80 | Temp 98.4°F | Ht 66.0 in | Wt 148.2 lb

## 2020-03-03 DIAGNOSIS — R109 Unspecified abdominal pain: Secondary | ICD-10-CM | POA: Diagnosis not present

## 2020-03-03 DIAGNOSIS — Z531 Procedure and treatment not carried out because of patient's decision for reasons of belief and group pressure: Secondary | ICD-10-CM | POA: Insufficient documentation

## 2020-03-03 DIAGNOSIS — R1031 Right lower quadrant pain: Secondary | ICD-10-CM | POA: Diagnosis not present

## 2020-03-03 DIAGNOSIS — H01009 Unspecified blepharitis unspecified eye, unspecified eyelid: Secondary | ICD-10-CM | POA: Diagnosis not present

## 2020-03-03 DIAGNOSIS — H00015 Hordeolum externum left lower eyelid: Secondary | ICD-10-CM | POA: Diagnosis not present

## 2020-03-03 LAB — POCT CBC
Granulocyte percent: 62.8 % (ref 37–80)
HCT, POC: 38.2 % (ref 29–41)
Hemoglobin: 13.2 g/dL (ref 11–14.6)
Lymph, poc: 2.2 (ref 0.6–3.4)
MCH, POC: 32.6 pg — AB (ref 27–31.2)
MCHC: 34.6 g/dL (ref 31.8–35.4)
MCV: 94.1 fL (ref 76–111)
MID (cbc): 1 — AB (ref 0–0.9)
MPV: 8.8 fL (ref 0–99.8)
POC Granulocyte: 5.5 (ref 2–6.9)
POC LYMPH PERCENT: 25.7 % (ref 10–50)
POC MID %: 11.5 % (ref 0–12)
Platelet Count, POC: 405 10*3/uL (ref 142–424)
RBC: 4.06 M/uL (ref 4.04–5.48)
RDW, POC: 13.4 %
WBC: 8.7 10*3/uL (ref 4.6–10.2)

## 2020-03-03 LAB — I-STAT BETA HCG BLOOD, ED (MC, WL, AP ONLY): I-stat hCG, quantitative: <5 m[IU]/mL

## 2020-03-03 LAB — POCT WET + KOH PREP
Trich by wet prep: ABSENT
Yeast by KOH: ABSENT
Yeast by wet prep: ABSENT

## 2020-03-03 LAB — COMPREHENSIVE METABOLIC PANEL
ALT: 16 U/L (ref 0–44)
AST: 17 U/L (ref 15–41)
Albumin: 4.6 g/dL (ref 3.5–5.0)
Alkaline Phosphatase: 47 U/L (ref 38–126)
Anion gap: 10 (ref 5–15)
BUN: 13 mg/dL (ref 6–20)
CO2: 27 mmol/L (ref 22–32)
Calcium: 9.3 mg/dL (ref 8.9–10.3)
Chloride: 100 mmol/L (ref 98–111)
Creatinine, Ser: 0.51 mg/dL (ref 0.44–1.00)
GFR calc Af Amer: >60 mL/min (ref >60)
GFR calc non Af Amer: >60 mL/min (ref >60)
Glucose, Bld: 90 mg/dL (ref 70–99)
Potassium: 3.5 mmol/L (ref 3.5–5.1)
Sodium: 137 mmol/L (ref 135–145)
Total Bilirubin: 0.4 mg/dL (ref 0.3–1.2)
Total Protein: 8.9 g/dL — ABNORMAL HIGH (ref 6.5–8.1)

## 2020-03-03 LAB — POCT URINALYSIS DIP (MANUAL ENTRY)
Bilirubin, UA: NEGATIVE
Glucose, UA: NEGATIVE mg/dL
Ketones, POC UA: NEGATIVE mg/dL
Leukocytes, UA: NEGATIVE
Nitrite, UA: NEGATIVE
Protein Ur, POC: NEGATIVE mg/dL
Spec Grav, UA: 1.025 (ref 1.010–1.025)
Urobilinogen, UA: 0.2 E.U./dL
pH, UA: 6 (ref 5.0–8.0)

## 2020-03-03 LAB — CBC
HCT: 37.7 % (ref 36.0–46.0)
Hemoglobin: 12.5 g/dL (ref 12.0–15.0)
MCH: 31.7 pg (ref 26.0–34.0)
MCHC: 33.2 g/dL (ref 30.0–36.0)
MCV: 95.7 fL (ref 80.0–100.0)
Platelets: 414 10*3/uL — ABNORMAL HIGH (ref 150–400)
RBC: 3.94 MIL/uL (ref 3.87–5.11)
RDW: 12.7 % (ref 11.5–15.5)
WBC: 8 10*3/uL (ref 4.0–10.5)
nRBC: 0 % (ref 0.0–0.2)

## 2020-03-03 LAB — LIPASE, BLOOD: Lipase: 25 U/L (ref 11–51)

## 2020-03-03 LAB — POCT URINE PREGNANCY: Preg Test, Ur: NEGATIVE

## 2020-03-03 MED ORDER — SODIUM CHLORIDE 0.9% FLUSH: 3.0000 mL | Freq: Once | INTRAVENOUS | Status: DC

## 2020-03-03 MED ORDER — ERYTHROMYCIN 5 MG/GM OP OINT: 1.0000 "application " | TOPICAL_OINTMENT | Freq: Every day | OPHTHALMIC | 0 refills | Status: DC

## 2020-03-03 NOTE — Patient Instructions (Addendum)
Based on history and my exam I am concerned that you are developing appendicitis. I recommend you get further evaluation at the ER.     If you have lab work done today you will be contacted with your lab results within the next 2 weeks.  If you have not heard from [place] then please contact us. The fastest way to get your results is to register for My Chart.   IF you received an x-ray today, you will receive an invoice from [facility]. Please contact [facility] at [phone] with questions or concerns regarding your invoice.   IF you received labwork today, you will receive an invoice from [place]. Please contact [facility] at [phone] with questions or concerns regarding your invoice.   Our billing staff will not be able to assist you with questions regarding bills from these companies.  You will be contacted with the lab results as soon as they are available. The fastest way to get your results is to activate your My Chart account. Instructions are located on the last page of this paperwork. If you have not heard from [place] regarding the results in 2 weeks, please contact this office.     Stye  A stye, also known as a hordeolum, is a bump that forms on an eyelid. It may look like a pimple next to the eyelash. A stye can form inside the eyelid (internal stye) or outside the eyelid (external stye). A stye can cause redness, swelling, and pain on the eyelid. Styes are very common. Anyone can get them at any age. They usually occur in just one eye, but you may have more than one in either eye. What are the causes? A stye is caused by an infection. The infection is almost always caused by bacteria called Staphylococcus aureus. This is a common type of bacteria that lives on the skin. An internal stye may result from an infected oil-producing gland inside the eyelid. An external stye may be caused by an infection at the base of the eyelash (hair follicle). What increases the  risk? You are more likely to develop a stye if:  You have had a stye before.  You have any of these conditions: ? Diabetes. ? Red, itchy, inflamed eyelids (blepharitis). ? A skin condition such as seborrheic dermatitis or rosacea. ? High fat levels in your blood (lipids). What are the signs or symptoms? The most common symptom of a stye is eyelid pain. Internal styes are more painful than external styes. Other symptoms may include:  Painful swelling of your eyelid.  A scratchy feeling in your eye.  Tearing and redness of your eye.  Pus draining from the stye. How is this diagnosed? Your health care provider may be able to diagnose a stye just by examining your eye. The health care provider may also check to make sure:  You do not have a fever or other signs of a more serious infection.  The infection has not spread to other parts of your eye or areas around your eye. How is this treated? Most styes will clear up in a few days without treatment or with warm compresses applied to the area. You may need to use antibiotic drops or ointment to treat an infection. In some cases, if your stye does not heal with routine treatment, your health care provider may drain pus from the stye using a thin blade or needle. This may be done if the stye is large, causing a lot of pain, or  affecting your vision. Follow these instructions at home:  Take over-the-counter and prescription medicines only as told by your health care provider. This includes eye drops or ointments.  If you were prescribed an antibiotic medicine, apply or use it as told by your health care provider. Do not stop using the antibiotic even if your condition improves.  Apply a warm, wet cloth (warm compress) to your eye for 5-10 minutes, 4 times a day.  Clean the affected eyelid as directed by your health care provider.  Do not wear contact lenses or eye makeup until your stye has healed.  Do not try to pop or drain the  stye.  Do not rub your eye. Contact a health care provider if:  You have chills or a fever.  Your stye does not go away after several days.  Your stye affects your vision.  Your eyeball becomes swollen, red, or painful. Get help right away if:  You have pain when moving your eye around. Summary  A stye is a bump that forms on an eyelid. It may look like a pimple next to the eyelash.  A stye can form inside the eyelid (internal stye) or outside the eyelid (external stye). A stye can cause redness, swelling, and pain on the eyelid.  Your health care provider may be able to diagnose a stye just by examining your eye.  Apply a warm, wet cloth (warm compress) to your eye for 5-10 minutes, 4 times a day. This information is not intended to replace advice given to you by your health care provider. Make sure you discuss any questions you have with your health care provider. Document Revised: 07/19/2017 Document Reviewed: 04/18/2017 Elsevier Patient Education  2020 [employer].

## 2020-03-03 NOTE — Progress Notes (Signed)
7/15/20212:36 PM  Tara Chapman August 22, 1995, 24 y.o., female [id number]  Chief Complaint  Patient presents with  . Eye Problem    left eye swelling says there are 3 small bumps for the past 3 months with itching,  burning and tearing    HPI:   Patient is a 24 y.o. female who presents today for left eye issues and right lower abd pain  3 months of bump along lower eyelid Started as itchy and tearing Wakes up with crusty eye Has not tried anything for this No vision changes, eye redness, photophobia, eye pain  Started having right abd pain yesterday, worse today Having mild dysuria, no hematuria, no frequency or urgency No fevers but having chills, sometimes nausea, decreased appetite (has not been able to eat much today) Having yellowish vaginal discharge but no odor Not sexually active for past 5 months LMP July 9th, irregular periods Normal BMs Has h/o pyelo  Arabic interpreter present   Hearing Screening   125Hz  250Hz  500Hz  1000Hz  2000Hz  3000Hz  4000Hz  6000Hz  8000Hz   Right ear:           Left ear:             Visual Acuity Screening   Right eye Left eye Both eyes  Without correction:  20/25-2   With correction:       Depression screen [facility] 2/9 03/03/2020 09/10/2017 07/18/2017  Decreased Interest 0 0 2  Down, Depressed, Hopeless 0 0 0  PHQ - 2 Score 0 0 2  Altered sleeping - 1 1  Tired, decreased energy - 0 1  Change in appetite - 0 1  Feeling bad or failure about yourself  - 0 1  Trouble concentrating - 0 0  Moving slowly or fidgety/restless - 0 0  Suicidal thoughts - 0 0  PHQ-9 Score - 1 6  Some recent data might be hidden    Fall Risk  03/03/2020  Falls in the past year? 0  Number falls in past yr: 0  Injury with Fall? 0     No Known Allergies  Prior to Admission medications   Medication Sig Start Date End Date Taking? Authorizing Provider  sodium chloride (OCEAN) 0.65 % SOLN nasal spray Place 2 sprays into both nostrils every 2  (two) hours while awake. 01/12/20 03/03/20 Yes Betancourt, 01-26-2001, NP    Past Medical History:  Diagnosis Date  . Medical history non-contributory     Past Surgical History:  Procedure Laterality Date  . NO PAST SURGERIES      Social History   Tobacco Use  . Smoking status: Never Smoker  . Smokeless tobacco: Never Used  Substance Use Topics  . Alcohol use: No    No family history on file.  ROS Per hpi  OBJECTIVE:  Today's Vitals   03/03/20 1421  BP: 112/77  Pulse: 80  Temp: 98.4 F (36.9 C)  SpO2: 100%  Weight: 148 lb 3.2 oz (67.2 kg)  Height: 5\' 6"  (1.676 m)   Body mass index is 23.92 kg/m.   Physical Exam Vitals and nursing note reviewed.  Constitutional:      Appearance: She is well-developed.  HENT:     Head: Normocephalic and atraumatic.     Mouth/Throat:     Pharynx: No oropharyngeal exudate.  Eyes:     General: No scleral icterus.       Left eye: Hordeolum present.    Extraocular Movements: Extraocular movements intact.     Conjunctiva/sclera:  Conjunctivae normal.     Pupils: Pupils are equal, round, and reactive to light.  Cardiovascular:     Rate and Rhythm: Normal rate and regular rhythm.     Heart sounds: Normal heart sounds. No murmur heard.  No friction rub. No gallop.   Pulmonary:     Effort: Pulmonary effort is normal.     Breath sounds: Normal breath sounds. No wheezing or rales.  Abdominal:     General: There is no distension.     Palpations: Abdomen is soft. There is no hepatomegaly or splenomegaly.     Tenderness: There is abdominal tenderness in the right lower quadrant and periumbilical area. There is no right CVA tenderness, left CVA tenderness, guarding or rebound. Positive signs include Rovsing's sign and McBurney's sign.  Musculoskeletal:     Cervical back: Neck supple.  Skin:    General: Skin is warm and dry.  Neurological:     Mental Status: She is alert and oriented to person, place, and time.     Results for  orders placed or performed in visit on 03/03/20 (from the past 24 hour(s))  POCT urinalysis dipstick     Status: Abnormal   Collection Time: 03/03/20  3:10 PM  Result Value Ref Range   Color, UA yellow yellow   Clarity, UA clear clear   Glucose, UA negative negative mg/dL   Bilirubin, UA negative negative   Ketones, POC UA negative negative mg/dL   Spec Grav, UA 0.923 3.007 - 1.025   Blood, UA moderate (A) negative   pH, UA 6.0 5.0 - 8.0   Protein Ur, POC negative negative mg/dL   Urobilinogen, UA 0.2 0.2 or 1.0 E.U./dL   Nitrite, UA Negative Negative   Leukocytes, UA Negative Negative  POCT Wet + KOH Prep     Status: Abnormal   Collection Time: 03/03/20  3:10 PM  Result Value Ref Range   Yeast by KOH Absent Absent   Yeast by wet prep Absent Absent   WBC by wet prep None (A) Few   Clue Cells Wet Prep HPF POC None None   Trich by wet prep Absent Absent   Bacteria Wet Prep HPF POC Few Few   Epithelial Cells By [employer] ([facility]) Few None, Few, Too numerous to count   RBC,UR,HPF,POC None None RBC/hpf  POCT urine pregnancy     Status: None   Collection Time: 03/03/20  3:10 PM  Result Value Ref Range   Preg Test, Ur Negative Negative  POCT CBC     Status: Abnormal   Collection Time: 03/03/20  3:18 PM  Result Value Ref Range   WBC 8.7 4.6 - 10.2 K/uL   Lymph, poc 2.2 0.6 - 3.4   POC LYMPH PERCENT 25.7 10 - 50 %L   MID (cbc) 1.0 (A) 0 - 0.9   POC MID % 11.5 0 - 12 %M   POC Granulocyte 5.5 2 - 6.9   Granulocyte percent 62.8 37 - 80 %G   RBC 4.06 4.04 - 5.48 M/uL   Hemoglobin 13.2 11 - 14.6 g/dL   HCT, POC 62.2 29 - 41 %   MCV 94.1 76 - 111 fL   MCH, POC 32.6 (A) 27 - 31.2 pg   MCHC 34.6 31.8 - 35.4 g/dL   RDW, POC 63.3 %   Platelet Count, POC 405 142 - 424 K/uL   MPV 8.8 0 - 99.8 fL    No results found.   ASSESSMENT and PLAN  1. Right lower quadrant abdominal pain History and exam concerning for appendicitis. Recommend ER evaluation.  - POCT urinalysis dipstick - POCT  Wet + KOH Prep - POCT CBC - POCT urine pregnancy  2. Hordeolum externum of left lower eyelid Trial of erythromycin ointment/warm compresses. If not resolved, would advise seeing ophtho for possible excision  Other orders - erythromycin ophthalmic ointment; Place 1 application into the left eye at bedtime.  No follow-ups on file.    Myles Lipps, MD Primary Care at [facility] [address] [place], [place] [address] Ph.  [phone] Fax [phone]

## 2020-03-03 NOTE — ED Triage Notes (Signed)
Pt reports to the ED for abdominal pain. Patinet denies chest pain, SOB, N/V/D. Patient reports it hurts with walking and eating. Patient reports LMP 6 days ago

## 2020-03-04 ENCOUNTER — Encounter (HOSPITAL_COMMUNITY): Payer: Self-pay

## 2020-03-04 ENCOUNTER — Emergency Department (HOSPITAL_COMMUNITY)
Admission: EM | Admit: 2020-03-04 | Discharge: 2020-03-04 | Disposition: A | Discharge status: Home / Self Care | Payer: 59 | Source: Home / Self Care | Attending: Emergency Medicine | Admitting: Emergency Medicine

## 2020-03-04 ENCOUNTER — Emergency Department (HOSPITAL_COMMUNITY): Payer: 59

## 2020-03-04 ENCOUNTER — Emergency Department (HOSPITAL_COMMUNITY)
Admission: EM | Admit: 2020-03-04 | Discharge: 2020-03-04 | Discharge status: Against Medical Advice | Payer: 59 | Attending: Emergency Medicine | Admitting: Emergency Medicine

## 2020-03-04 DIAGNOSIS — K458 Other specified abdominal hernia without obstruction or gangrene: Secondary | ICD-10-CM

## 2020-03-04 DIAGNOSIS — K469 Unspecified abdominal hernia without obstruction or gangrene: Secondary | ICD-10-CM | POA: Insufficient documentation

## 2020-03-04 LAB — COMPREHENSIVE METABOLIC PANEL
ALT: 15 U/L (ref 0–44)
AST: 26 U/L (ref 15–41)
Albumin: 4.5 g/dL (ref 3.5–5.0)
Alkaline Phosphatase: 45 U/L (ref 38–126)
Anion gap: 9 (ref 5–15)
BUN: 11 mg/dL (ref 6–20)
CO2: 26 mmol/L (ref 22–32)
Calcium: 9.2 mg/dL (ref 8.9–10.3)
Chloride: 101 mmol/L (ref 98–111)
Creatinine, Ser: 0.67 mg/dL (ref 0.44–1.00)
GFR calc Af Amer: >60 mL/min (ref >60)
GFR calc non Af Amer: >60 mL/min (ref >60)
Glucose, Bld: 87 mg/dL (ref 70–99)
Potassium: 4.7 mmol/L (ref 3.5–5.1)
Sodium: 136 mmol/L (ref 135–145)
Total Bilirubin: 0.7 mg/dL (ref 0.3–1.2)
Total Protein: 9.4 g/dL — ABNORMAL HIGH (ref 6.5–8.1)

## 2020-03-04 LAB — LIPASE, BLOOD: Lipase: 25 U/L (ref 11–51)

## 2020-03-04 LAB — URINALYSIS, ROUTINE W REFLEX MICROSCOPIC
Bilirubin Urine: NEGATIVE
Glucose, UA: NEGATIVE mg/dL
Ketones, ur: NEGATIVE mg/dL
Leukocytes,Ua: NEGATIVE
Nitrite: NEGATIVE
Protein, ur: NEGATIVE mg/dL
Specific Gravity, Urine: 1.004 — ABNORMAL LOW (ref 1.005–1.030)
pH: 8 (ref 5.0–8.0)

## 2020-03-04 LAB — CBC
HCT: 40.9 % (ref 36.0–46.0)
Hemoglobin: 13.2 g/dL (ref 12.0–15.0)
MCH: 31.1 pg (ref 26.0–34.0)
MCHC: 32.3 g/dL (ref 30.0–36.0)
MCV: 96.5 fL (ref 80.0–100.0)
Platelets: 416 10*3/uL — ABNORMAL HIGH (ref 150–400)
RBC: 4.24 MIL/uL (ref 3.87–5.11)
RDW: 12.7 % (ref 11.5–15.5)
WBC: 7.6 10*3/uL (ref 4.0–10.5)
nRBC: 0 % (ref 0.0–0.2)

## 2020-03-04 LAB — PREGNANCY, URINE: Preg Test, Ur: NEGATIVE

## 2020-03-04 MED ORDER — IOHEXOL 300 MG/ML  SOLN
100.0000 mL | Freq: Once | INTRAMUSCULAR | Status: AC | PRN
  Administered 2020-03-04: 100 mL via INTRAVENOUS

## 2020-03-04 MED ORDER — ONDANSETRON 4 MG PO TBDP
4.0000 mg | ORAL_TABLET | Freq: Three times a day (TID) | ORAL | 0 refills | Status: DC | PRN
Start: 2020-03-04 — End: 2020-05-23

## 2020-03-04 MED ORDER — OXYCODONE HCL 5 MG PO TABS: 5.0000 mg | ORAL_TABLET | Freq: Four times a day (QID) | ORAL | 0 refills | Status: DC | PRN

## 2020-03-04 MED ORDER — SODIUM CHLORIDE 0.9 % IV BOLUS
1000.0000 mL | Freq: Once | INTRAVENOUS | Status: AC
  Administered 2020-03-04: 17:00:00 1000 mL via INTRAVENOUS

## 2020-03-04 NOTE — ED Triage Notes (Signed)
Pt reports continued abdominal pain. She left yesterday after waiting for "too long." Denies N/V. States that she wants her blood work from yesterday used for todays visit.

## 2020-03-04 NOTE — ED Provider Notes (Signed)
[facility]-EMERGENCY DEPT Provider Note   CSN: [id number] Arrival date & time: 03/04/20  0526     History Chief Complaint  Patient presents with  . Abdominal Pain    Tara Chapman is a 24 y.o. female.  The history is provided by the patient. The history is limited by a language barrier. A language interpreter was used.  Abdominal Pain Pain location:  RLQ Pain quality: aching   Pain radiates to:  Does not radiate Pain severity:  Moderate Onset quality:  Gradual Duration:  4 days Timing:  Constant Progression:  Worsening Chronicity:  New Context comment:  Unclear why Relieved by:  Nothing Worsened by:  Nothing Ineffective treatments:  None tried Associated symptoms: anorexia, nausea and vomiting   Associated symptoms: no chest pain, no chills, no cough, no diarrhea, no dysuria, no fever and no shortness of breath        Past Medical History:  Diagnosis Date  . Medical history non-contributory     Patient Active Problem List   Diagnosis Date Noted  . Language barrier, cultural differences 01/31/2017    Past Surgical History:  Procedure Laterality Date  . NO PAST SURGERIES       OB History    Gravida  1   Para  1   Term  1   Preterm      AB      Living  1     SAB      TAB      Ectopic      Multiple  0   Live Births  1           History reviewed. No pertinent family history.  Social History   Tobacco Use  . Smoking status: Never Smoker  . Smokeless tobacco: Never Used  Substance Use Topics  . Alcohol use: No  . Drug use: No    Home Medications Prior to Admission medications   Medication Sig Start Date End Date Taking? Authorizing Provider  erythromycin ophthalmic ointment Place 1 application into the left eye at bedtime. 03/03/20   Myles Lipps, MD  ondansetron (ZOFRAN ODT) 4 MG disintegrating tablet Take 1 tablet (4 mg total) by mouth every 8 (eight) hours as needed for up to 10  doses for nausea or vomiting. 03/04/20   Sabino Donovan, MD  oxyCODONE (ROXICODONE) 5 MG immediate release tablet Take 1 tablet (5 mg total) by mouth every 6 (six) hours as needed for up to 12 doses for severe pain. 03/04/20   Sabino Donovan, MD  sodium chloride (OCEAN) 0.65 % SOLN nasal spray Place 2 sprays into both nostrils every 2 (two) hours while awake. 01/12/20 03/03/20  Betancourt, Jarold Song, NP    Allergies    Patient has no known allergies.  Review of Systems   Review of Systems  Constitutional: Negative for chills and fever.  HENT: Negative for congestion and rhinorrhea.   Respiratory: Negative for cough and shortness of breath.   Cardiovascular: Negative for chest pain and palpitations.  Gastrointestinal: Positive for abdominal pain, anorexia, nausea and vomiting. Negative for diarrhea.  Genitourinary: Negative for difficulty urinating and dysuria.  Musculoskeletal: Negative for arthralgias and back pain.  Skin: Negative for rash and wound.  Neurological: Negative for light-headedness and headaches.    Physical Exam Updated Vital Signs BP 122/82   Pulse 71   Temp 99.2 F (37.3 C) (Oral)   Resp 16   LMP 02/26/2020   SpO2  100%   Physical Exam Vitals and nursing note reviewed. Exam conducted with a chaperone present.  Constitutional:      General: She is not in acute distress.    Appearance: Normal appearance.  HENT:     Head: Normocephalic and atraumatic.     Nose: No rhinorrhea.  Eyes:     General:        Right eye: No discharge.        Left eye: No discharge.     Conjunctiva/sclera: Conjunctivae normal.  Cardiovascular:     Rate and Rhythm: Normal rate. Rhythm irregular.  Pulmonary:     Effort: Pulmonary effort is normal. No respiratory distress.     Breath sounds: No stridor.  Abdominal:     General: Abdomen is flat. There is no distension.     Palpations: Abdomen is soft.     Tenderness: There is abdominal tenderness in the right lower quadrant. There is  guarding. There is no rebound.  Musculoskeletal:        General: No tenderness or signs of injury.  Skin:    General: Skin is warm and dry.  Neurological:     General: No focal deficit present.     Mental Status: She is alert. Mental status is at baseline.     Motor: No weakness.  Psychiatric:        Mood and Affect: Mood normal.        Behavior: Behavior normal.     ED Results / Procedures / Treatments   Labs (all labs ordered are listed, but only abnormal results are displayed) Labs Reviewed  CBC - Abnormal; Notable for the following components:      Result Value   Platelets 416 (*)    All other components within normal limits  COMPREHENSIVE METABOLIC PANEL - Abnormal; Notable for the following components:   Total Protein 9.4 (*)    All other components within normal limits  URINALYSIS, ROUTINE W REFLEX MICROSCOPIC - Abnormal; Notable for the following components:   Color, Urine STRAW (*)    Specific Gravity, Urine 1.004 (*)    Hgb urine dipstick SMALL (*)    Bacteria, UA RARE (*)    All other components within normal limits  LIPASE, BLOOD  PREGNANCY, URINE  POC URINE PREG, ED    EKG None  Radiology CT ABDOMEN PELVIS W CONTRAST  Result Date: 03/04/2020 CLINICAL DATA:  Right lower quadrant abdominal pain. EXAM: CT ABDOMEN AND PELVIS WITH CONTRAST TECHNIQUE: Multidetector CT imaging of the abdomen and pelvis was performed using the standard protocol following bolus administration of intravenous contrast. CONTRAST:  OMNIPAQUE IOHEXOL 300 MG/ML  SOLN COMPARISON:  None. FINDINGS: Lower chest: The lung bases are clear. Hepatobiliary: No focal liver abnormality is seen. No gallstones, gallbladder wall thickening, or biliary dilatation. Pancreas: No ductal dilatation or inflammation. Spleen: Normal in size without focal abnormality. Adrenals/Urinary Tract: Normal adrenal glands. No hydronephrosis or perinephric edema. Homogeneous renal enhancement. Urinary bladder is  physiologically distended without wall thickening. Stomach/Bowel: The appendix is normal, for example series 5, image 50. Stomach is unremarkable. Normal positioning of the duodenum and ligament of Treitz. There are small bowel loops that project lateral to the ascending colon there are difficult to accurately delineate in the absence of enteric contrast, but suspicious for internal hernia. No evidence of incarceration or ischemia. The terminal ileum is normal. Moderate volume of stool throughout the colon. Vascular/Lymphatic: Patent portal vein. No portal or mesenteric venous gas. The abdominal aorta  is normal in caliber. No adenopathy. Reproductive: Uterus and bilateral adnexa are unremarkable. Other: No free air, free fluid, or intra-abdominal fluid collection. Tiny fat containing umbilical hernia. Musculoskeletal: There are no acute or suspicious osseous abnormalities. IMPRESSION: 1. Normal appendix. 2. There are small bowel loops that project lateral to the ascending colon, difficult to accurately delineate in the absence of enteric contrast. Findings are suspicious for internal hernia. No evidence of obstruction, incarceration or ischemia. 3. Moderate colonic stool burden, can be seen with constipation. Electronically Signed   By: Narda Rutherford M.D.   On: 03/04/2020 19:34    Procedures Procedures (including critical care time)  Medications Ordered in ED Medications  sodium chloride 0.9 % bolus 1,000 mL (0 mLs Intravenous Stopped 03/04/20 2038)  iohexol (OMNIPAQUE) 300 MG/ML solution 100 mL (100 mLs Intravenous Contrast Given 03/04/20 1902)    ED Course  I have reviewed the triage vital signs and the nursing notes.  Pertinent labs & imaging results that were available during my care of the patient were reviewed by me and considered in my medical decision making (see chart for details).    MDM Rules/Calculators/A&P                          4 days of worsening right lower quadrant pain with  nausea vomiting anorexia. No fevers chills. Sent from urgent care for abscess and discitis rule out. She denies any concerns for STI or urinary tract infection. She denies any trauma or other illness. She will get a CT scan and baseline abdominal labs. She will be given IV fluids. Pain control and nausea control were offered but declined by the patient. An Arabic language interpreter was used.  The patient was reassessed after her CT scan, she was well-appearing and comfortable, she had no nausea vomiting.  Laboratory analysis shows no signs of pregnancy no significant signs of urinary tract infection no leukocytosis or significant electrolyte abnormalities.  CT scan reviewed by radiology myself shows an internal hernia as a possibility in the right lower quadrant.  It does not show any signs of incarceration strangulation or ischemia.  I consulted the general surgery team to see if she needs emergent follow-up consultation or just routine follow-up.  They recommended pain control and outpatient follow-up.  She is provided this.  I use an Arabic interpreter to go over these results with her and explained her current condition.  She understands this if she will be discharged home with pain control nausea control and outpatient follow-up.  She is invited to return anytime for further evaluation: Worsening condition and she has no further questions..  Final Clinical Impression(s) / ED Diagnoses Final diagnoses:  Internal hernia    Rx / DC Orders ED Discharge Orders         Ordered    oxyCODONE (ROXICODONE) 5 MG immediate release tablet  Every 6 hours PRN     Discontinue  Reprint     03/04/20 2023    ondansetron (ZOFRAN ODT) 4 MG disintegrating tablet  Every 8 hours PRN     Discontinue  Reprint     03/04/20 2023           Sabino Donovan, MD 03/05/20 [phone]

## 2020-03-08 ENCOUNTER — Telehealth: Payer: Self-pay | Admitting: Family Medicine

## 2020-03-08 ENCOUNTER — Telehealth: Payer: Self-pay

## 2020-03-08 NOTE — Telephone Encounter (Signed)
Pt is requesting note for return to work with restriction. Doctor has restricted pt to no more than 5lbs lifting and okay to stand while working. Letter for pt will be located at front office pick up box. Pt is aware

## 2020-03-08 NOTE — Telephone Encounter (Signed)
Error

## 2020-03-08 NOTE — Telephone Encounter (Signed)
Patient stated she called earlier today/ pt just spoke to her HR dept and they are  requesting a note before she can retuirn to work /  Patient is concerned because she cant get here today    Tara Chapman spoke with patient  To resolve issue

## 2020-03-09 ENCOUNTER — Other Ambulatory Visit: Payer: Self-pay

## 2020-03-09 ENCOUNTER — Encounter: Payer: Self-pay | Admitting: Family Medicine

## 2020-03-09 ENCOUNTER — Ambulatory Visit (INDEPENDENT_AMBULATORY_CARE_PROVIDER_SITE_OTHER): Payer: 59 | Admitting: Family Medicine

## 2020-03-09 VITALS — BP 106/72 | HR 90 | Temp 98.5°F | Ht 66.0 in | Wt 147.0 lb

## 2020-03-09 DIAGNOSIS — K458 Other specified abdominal hernia without obstruction or gangrene: Secondary | ICD-10-CM

## 2020-03-09 DIAGNOSIS — R1031 Right lower quadrant pain: Secondary | ICD-10-CM

## 2020-03-09 NOTE — Progress Notes (Signed)
Subjective:  Patient ID: Tara Chapman, female    DOB: February 21, 1996  Age: 24 y.o. MRN: [id number]  Arabic video interpreter [phone] utilized for visit.  CC:  Chief Complaint  Patient presents with  . Hospitalization Follow-up    Pt reports she is no longer having pain while using the restroom. pt states the prescriptions give to the pt have been helping well.  PT reports she needs a work note saying if she can return to work or no and if so at what capasity.    HPI Tara Chapman presents for    Hospital follow-up. Emergency room note reviewed from July 16, right lower quadrant abdominal pain with nausea vomiting anorexia.  Labs without sign of pregnancy, no apparent sign of UTI-rare bacteria only, 0-5 WBC., no leukocytosis or significant electrolyte  abnormalities.  There was an internal hernia as a possibility in the right lower quadrant on CT scan but no sign of incarceration, strangulation or ischemia.  Pain control and outpatient follow-up recommended after discussion with general surgery. No further pain. Eating and drinking ok. Sx's improved - pain resolved day after ER visit.  Dd not fill Rx for oxycodone or zofran - not needed.  Letter completed by PCP yesterday -  Telephone message noted from yesterday, letter has been provided for restrictions with 5 pounds of lifting, and okay to stand while working.  CT ABDOMEN PELVIS W CONTRAST  Result Date: 03/04/2020 CLINICAL DATA:  Right lower quadrant abdominal pain. EXAM: CT ABDOMEN AND PELVIS WITH CONTRAST TECHNIQUE: Multidetector CT imaging of the abdomen and pelvis was performed using the standard protocol following bolus administration of intravenous contrast. CONTRAST:  OMNIPAQUE IOHEXOL 300 MG/ML  SOLN COMPARISON:  None. FINDINGS: Lower chest: The lung bases are clear. Hepatobiliary: No focal liver abnormality is seen. No gallstones, gallbladder wall thickening, or biliary dilatation. Pancreas:  No ductal dilatation or inflammation. Spleen: Normal in size without focal abnormality. Adrenals/Urinary Tract: Normal adrenal glands. No hydronephrosis or perinephric edema. Homogeneous renal enhancement. Urinary bladder is physiologically distended without wall thickening. Stomach/Bowel: The appendix is normal, for example series 5, image 50. Stomach is unremarkable. Normal positioning of the duodenum and ligament of Treitz. There are small bowel loops that project lateral to the ascending colon there are difficult to accurately delineate in the absence of enteric contrast, but suspicious for internal hernia. No evidence of incarceration or ischemia. The terminal ileum is normal. Moderate volume of stool throughout the colon. Vascular/Lymphatic: Patent portal vein. No portal or mesenteric venous gas. The abdominal aorta is normal in caliber. No adenopathy. Reproductive: Uterus and bilateral adnexa are unremarkable. Other: No free air, free fluid, or intra-abdominal fluid collection. Tiny fat containing umbilical hernia. Musculoskeletal: There are no acute or suspicious osseous abnormalities. IMPRESSION: 1. Normal appendix. 2. There are small bowel loops that project lateral to the ascending colon, difficult to accurately delineate in the absence of enteric contrast. Findings are suspicious for internal hernia. No evidence of obstruction, incarceration or ischemia. 3. Moderate colonic stool burden, can be seen with constipation. Electronically Signed   By: Narda Rutherford M.D.   On: 03/04/2020 19:34     History Patient Active Problem List   Diagnosis Date Noted  . Language barrier, cultural differences 01/31/2017   Past Medical History:  Diagnosis Date  . Medical history non-contributory    Past Surgical History:  Procedure Laterality Date  . NO PAST SURGERIES     No Known Allergies Prior to Admission medications  Medication Sig Start Date End Date Taking? Authorizing Provider  erythromycin  ophthalmic ointment Place 1 application into the left eye at bedtime. 03/03/20  Yes Myles Lipps, MD  ondansetron (ZOFRAN ODT) 4 MG disintegrating tablet Take 1 tablet (4 mg total) by mouth every 8 (eight) hours as needed for up to 10 doses for nausea or vomiting. 03/04/20  Yes Sabino Donovan, MD  oxyCODONE (ROXICODONE) 5 MG immediate release tablet Take 1 tablet (5 mg total) by mouth every 6 (six) hours as needed for up to 12 doses for severe pain. 03/04/20  Yes Sabino Donovan, MD  sodium chloride (OCEAN) 0.65 % SOLN nasal spray Place 2 sprays into both nostrils every 2 (two) hours while awake. 01/12/20 03/03/20  Betancourt, Jarold Song, NP   Social History   Socioeconomic History  . Marital status: Married    Spouse name: Not on file  . Number of children: Not on file  . Years of education: Not on file  . Highest education level: Not on file  Occupational History  . Not on file  Tobacco Use  . Smoking status: Never Smoker  . Smokeless tobacco: Never Used  Substance and Sexual Activity  . Alcohol use: No  . Drug use: No  . Sexual activity: Yes  Other Topics Concern  . Not on file  Social History Narrative  . Not on file   Social Determinants of Health   Financial Resource Strain:   . Difficulty of Paying Living Expenses:   Food Insecurity:   . Worried About Programme researcher, broadcasting/film/video in the Last Year:   . Barista in the Last Year:   Transportation Needs:   . Freight forwarder (Medical):   Marland Kitchen Lack of Transportation (Non-Medical):   Physical Activity:   . Days of Exercise per Week:   . Minutes of Exercise per Session:   Stress:   . Feeling of Stress :   Social Connections:   . Frequency of Communication with Friends and Family:   . Frequency of Social Gatherings with Friends and Family:   . Attends Religious Services:   . Active Member of Clubs or Organizations:   . Attends Banker Meetings:   Marland Kitchen Marital Status:   Intimate Partner Violence:   . Fear of Current  or Ex-Partner:   . Emotionally Abused:   Marland Kitchen Physically Abused:   . Sexually Abused:     Review of Systems  Per HPI.  Objective:   Vitals:   03/09/20 1454  BP: 106/72  Pulse: 90  Temp: 98.5 F (36.9 C)  TempSrc: Temporal  SpO2: 99%  Weight: 147 lb (66.7 kg)  Height: 5\' 6"  (1.676 m)     Physical Exam Vitals reviewed.  Constitutional:      General: She is not in acute distress.    Appearance: She is well-developed.  HENT:     Head: Normocephalic and atraumatic.  Cardiovascular:     Rate and Rhythm: Normal rate.  Pulmonary:     Effort: Pulmonary effort is normal.  Abdominal:     General: There is no distension.     Tenderness: There is no abdominal tenderness. There is no guarding.     Hernia: No hernia (no external hernia appreciated. ) is present.  Neurological:     Mental Status: She is alert and oriented to person, place, and time.        Assessment & Plan:  Tara Chapman is a  24 y.o. female . RLQ abdominal pain - Plan: Ambulatory referral to General Surgery  Internal hernia - Plan: Ambulatory referral to General Surgery Symptomatically improved, has been pain-free since day after ER visit.  Did not require pain medications or antiemetics outpatient.  Possible internal hernia previously noted by CT.  Will refer to general surgeon to evaluate and determine if any restrictions needed.  Letter was provided from yesterday regarding restrictions from her PCP.  RTC/ER precautions given, understand expressed with use of video interpreter  No orders of the defined types were placed in this encounter.  Patient Instructions    I do recommend follow up with general surgeon. I will place that referral.  If any return of pain - return to see medical provider sooner.   Return to the clinic or go to the nearest emergency room if any of your symptoms worsen or new symptoms occur.   If you have lab work done today you will be contacted with your lab  results within the next 2 weeks.  If you have not heard from [place] then please contact us. The fastest way to get your results is to register for My Chart.   IF you received an x-ray today, you will receive an invoice from [facility]. Please contact [facility] at [phone] with questions or concerns regarding your invoice.   IF you received labwork today, you will receive an invoice from [place]. Please contact [facility] at [phone] with questions or concerns regarding your invoice.   Our billing staff will not be able to assist you with questions regarding bills from these companies.  You will be contacted with the lab results as soon as they are available. The fastest way to get your results is to activate your My Chart account. Instructions are located on the last page of this paperwork. If you have not heard from [place] regarding the results in 2 weeks, please contact this office.         Signed, Meredith Staggers, MD [facility]

## 2020-03-09 NOTE — Patient Instructions (Addendum)
°  I do recommend follow up with general surgeon. I will place that referral.  If any return of pain - return to see medical provider sooner.   Return to the clinic or go to the nearest emergency room if any of your symptoms worsen or new symptoms occur.   If you have lab work done today you will be contacted with your lab results within the next 2 weeks.  If you have not heard from [place] then please contact us. The fastest way to get your results is to register for My Chart.   IF you received an x-ray today, you will receive an invoice from [facility]. Please contact [facility] at [phone] with questions or concerns regarding your invoice.   IF you received labwork today, you will receive an invoice from [place]. Please contact [facility] at [phone] with questions or concerns regarding your invoice.   Our billing staff will not be able to assist you with questions regarding bills from these companies.  You will be contacted with the lab results as soon as they are available. The fastest way to get your results is to activate your My Chart account. Instructions are located on the last page of this paperwork. If you have not heard from [place] regarding the results in 2 weeks, please contact this office.

## 2020-03-24 ENCOUNTER — Other Ambulatory Visit: Payer: Self-pay

## 2020-03-24 ENCOUNTER — Ambulatory Visit (INDEPENDENT_AMBULATORY_CARE_PROVIDER_SITE_OTHER): Payer: 59 | Admitting: Family Medicine

## 2020-03-24 ENCOUNTER — Encounter: Payer: Self-pay | Admitting: Family Medicine

## 2020-03-24 VITALS — BP 104/68 | HR 86 | Temp 98.7°F | Ht 66.0 in | Wt 146.0 lb

## 2020-03-24 DIAGNOSIS — H00015 Hordeolum externum left lower eyelid: Secondary | ICD-10-CM | POA: Diagnosis not present

## 2020-03-24 DIAGNOSIS — K458 Other specified abdominal hernia without obstruction or gangrene: Secondary | ICD-10-CM

## 2020-03-24 NOTE — Progress Notes (Signed)
8/5/202111:47 AM  Tara Chapman Tara Chapman 05/19/1996, 24 y.o., female [id number]  Chief Complaint  Patient presents with  . R side abd pain    ER visit 7/16 - referral to surgeon placed - has not been contacted   . L eye stye    using rx ointment - no changes    HPI:   Patient is a 24 y.o. female who presents today for ER followup  Seen July 16 -  Suspected R Internal hernia, referred to gen surg, has not heard form them yet Left eye stye tx with erythromycin  She is overall doing well Her RLQ abd pain has resolved completely Normal BMs, no nausea or vomiting, normal appetite Wants to be released back to work  She has been using antibiotics and warm compresses on left lower eyelid stye for about 2 weeks Stye not better at all Occasionally drains She does not use makeup or wear eyeglasses Present for 4 months  Depression screen [facility] 2/9 03/24/2020 03/09/2020 03/03/2020  Decreased Interest 0 0 0  Down, Depressed, Hopeless 0 0 0  PHQ - 2 Score 0 0 0  Altered sleeping - - -  Tired, decreased energy - - -  Change in appetite - - -  Feeling bad or failure about yourself  - - -  Trouble concentrating - - -  Moving slowly or fidgety/restless - - -  Suicidal thoughts - - -  PHQ-9 Score - - -  Some recent data might be hidden    Fall Risk  03/24/2020 03/09/2020 03/03/2020  Falls in the past year? 0 0 0  Number falls in past yr: 0 - 0  Injury with Fall? 0 - 0  Follow up Falls evaluation completed Falls evaluation completed -     No Known Allergies  Prior to Admission medications   Medication Sig Start Date End Date Taking? Authorizing Provider  erythromycin ophthalmic ointment Place 1 application into the left eye at bedtime. 03/03/20  Yes Myles Lipps, MD  ondansetron (ZOFRAN ODT) 4 MG disintegrating tablet Take 1 tablet (4 mg total) by mouth every 8 (eight) hours as needed for up to 10 doses for nausea or vomiting. Patient not taking: Reported on 03/24/2020  03/04/20   Sabino Donovan, MD  oxyCODONE (ROXICODONE) 5 MG immediate release tablet Take 1 tablet (5 mg total) by mouth every 6 (six) hours as needed for up to 12 doses for severe pain. Patient not taking: Reported on 03/24/2020 03/04/20   Sabino Donovan, MD  sodium chloride (OCEAN) 0.65 % SOLN nasal spray Place 2 sprays into both nostrils every 2 (two) hours while awake. Patient not taking: Reported on 03/24/2020 01/12/20 03/03/20  Barbaraann Barthel, NP    Past Medical History:  Diagnosis Date  . Medical history non-contributory     Past Surgical History:  Procedure Laterality Date  . NO PAST SURGERIES      Social History   Tobacco Use  . Smoking status: Never Smoker  . Smokeless tobacco: Never Used  Substance Use Topics  . Alcohol use: No    No family history on file.  ROS Per hpi  OBJECTIVE:  Today's Vitals   03/24/20 1130  BP: 104/68  Pulse: 86  Temp: 98.7 F (37.1 C)  SpO2: 97%  Weight: 146 lb (66.2 kg)  Height: 5\' 6"  (1.676 m)   Body mass index is 23.57 kg/m.   Physical Exam Vitals and nursing note reviewed.  Constitutional:  Appearance: She is well-developed.  HENT:     Head: Normocephalic and atraumatic.  Eyes:     General: No scleral icterus.    Conjunctiva/sclera: Conjunctivae normal.     Pupils: Pupils are equal, round, and reactive to light.  Pulmonary:     Effort: Pulmonary effort is normal.  Abdominal:     General: Bowel sounds are normal. There is no distension.     Palpations: Abdomen is soft. There is no hepatomegaly or splenomegaly.     Tenderness: There is no abdominal tenderness.  Musculoskeletal:     Cervical back: Neck supple.  Skin:    General: Skin is warm and dry.  Neurological:     Mental Status: She is alert and oriented to person, place, and time.     No results found for this or any previous visit (from the past 24 hour(s)).  No results found.   ASSESSMENT and PLAN  1. Internal hernia Provided contact info to make  appt with surg. Currently no pain. ER precautions given. Letter for work given.   2. Hordeolum externum of left lower eyelid Cont supportive measures  Return if symptoms worsen or fail to improve.    Myles Lipps, MD Primary Care at [facility] [address] [place], [place] [address] Ph.  [phone] Fax [phone]

## 2020-03-24 NOTE — Patient Instructions (Addendum)
  [facility]  [address] [phone]   If you have lab work done today you will be contacted with your lab results within the next 2 weeks.  If you have not heard from [place] then please contact us. The fastest way to get your results is to register for My Chart.   IF you received an x-ray today, you will receive an invoice from [facility]. Please contact [facility] at [phone] with questions or concerns regarding your invoice.   IF you received labwork today, you will receive an invoice from [place]. Please contact [facility] at [phone] with questions or concerns regarding your invoice.   Our billing staff will not be able to assist you with questions regarding bills from these companies.  You will be contacted with the lab results as soon as they are available. The fastest way to get your results is to activate your My Chart account. Instructions are located on the last page of this paperwork. If you have not heard from [place] regarding the results in 2 weeks, please contact this office.

## 2020-03-25 ENCOUNTER — Ambulatory Visit: Payer: Self-pay | Admitting: *Deleted

## 2020-03-25 NOTE — Telephone Encounter (Signed)
Call received from patient's employer, Richardo Hanks; at [employer]. Patient gave information to call office and assist with understanding her medical needs. Patient was not with employer at this time. No information was given to acknowledge patient from this RN . Employer verified patient's name and DOB. Employer states that patient was given restrictions for work but does not feel patient will be able to comply with restrictions and would like clarification if patient can lift greater than 20 lbs and stand at least 8 hours at one time for work. Employer concerned patient is not understanding future medical plans for surgery due to language barrier despite using an interpreter. Employer is requesting PCP talk with patient and confirm what surgery she will be having and when surgery will happen. Employer feels patient only has consultation on 10/8 and not surgery. Patient is confused of up coming medical events per employer. Please advise and send any work related restrictions to [employer] Fax: # [phone]. For further questions to assist patient, employer Marcelino Duster can be reached at # [phone].

## 2020-03-28 NOTE — Telephone Encounter (Signed)
Dr. Leretha Pol is asking for this pt to come in again for another ov to discuss matter of her returning to work and limitations

## 2020-03-28 NOTE — Telephone Encounter (Signed)
LVMTCB to sch appt to discuss her returning to work and limitations

## 2020-03-29 ENCOUNTER — Telehealth: Payer: Self-pay | Admitting: Family Medicine

## 2020-03-29 NOTE — Telephone Encounter (Signed)
FYI-Pt's boss is calling wanting info on the pt. We can not give it. Richardo Hanks is not on her Hippa.

## 2020-04-04 ENCOUNTER — Other Ambulatory Visit: Payer: Self-pay | Admitting: Surgery

## 2020-04-04 DIAGNOSIS — R935 Abnormal findings on diagnostic imaging of other abdominal regions, including retroperitoneum: Secondary | ICD-10-CM

## 2020-04-04 DIAGNOSIS — R109 Unspecified abdominal pain: Secondary | ICD-10-CM

## 2020-04-15 ENCOUNTER — Ambulatory Visit
Admission: RE | Admit: 2020-04-15 | Discharge: 2020-04-15 | Disposition: A | Discharge status: Home / Self Care | Payer: 59 | Source: Ambulatory Visit | Attending: Surgery | Admitting: Surgery

## 2020-04-15 ENCOUNTER — Other Ambulatory Visit: Payer: Self-pay

## 2020-04-15 ENCOUNTER — Other Ambulatory Visit: Payer: Self-pay | Admitting: Surgery

## 2020-04-15 DIAGNOSIS — R109 Unspecified abdominal pain: Secondary | ICD-10-CM

## 2020-04-15 DIAGNOSIS — R935 Abnormal findings on diagnostic imaging of other abdominal regions, including retroperitoneum: Secondary | ICD-10-CM

## 2020-04-28 ENCOUNTER — Ambulatory Visit
Admission: RE | Admit: 2020-04-28 | Discharge: 2020-04-28 | Disposition: A | Discharge status: Home / Self Care | Payer: 59 | Source: Ambulatory Visit | Attending: Surgery | Admitting: Surgery

## 2020-04-28 DIAGNOSIS — R935 Abnormal findings on diagnostic imaging of other abdominal regions, including retroperitoneum: Secondary | ICD-10-CM

## 2020-04-28 DIAGNOSIS — R109 Unspecified abdominal pain: Secondary | ICD-10-CM

## 2020-04-28 MED ORDER — IOPAMIDOL (ISOVUE-300) INJECTION 61%
100.0000 mL | Freq: Once | INTRAVENOUS | Status: AC | PRN
  Administered 2020-04-28: 100 mL via INTRAVENOUS

## 2020-05-23 ENCOUNTER — Ambulatory Visit (INDEPENDENT_AMBULATORY_CARE_PROVIDER_SITE_OTHER): Payer: 59 | Admitting: Family Medicine

## 2020-05-23 ENCOUNTER — Encounter: Payer: Self-pay | Admitting: Family Medicine

## 2020-05-23 ENCOUNTER — Other Ambulatory Visit: Payer: Self-pay

## 2020-05-23 VITALS — BP 115/79 | HR 81 | Temp 97.4°F | Resp 18 | Ht 66.0 in | Wt 150.6 lb

## 2020-05-23 DIAGNOSIS — H0015 Chalazion left lower eyelid: Secondary | ICD-10-CM | POA: Diagnosis not present

## 2020-05-23 DIAGNOSIS — N644 Mastodynia: Secondary | ICD-10-CM | POA: Diagnosis not present

## 2020-05-23 NOTE — Patient Instructions (Signed)
° ° ° °  If you have lab work done today you will be contacted with your lab results within the next 2 weeks.  If you have not heard from us then please contact us. The fastest way to get your results is to register for My Chart. ° ° °IF you received an x-ray today, you will receive an invoice from [facility]. Please contact  Radiology at [phone] with questions or concerns regarding your invoice.  ° °IF you received labwork today, you will receive an invoice from [facility]. Please contact [facility] at [phone] with questions or concerns regarding your invoice.  ° °Our billing staff will not be able to assist you with questions regarding bills from these companies. ° °You will be contacted with the lab results as soon as they are available. The fastest way to get your results is to activate your My Chart account. Instructions are located on the last page of this paperwork. If you have not heard from us regarding the results in 2 weeks, please contact this office. °  ° ° ° °

## 2020-05-23 NOTE — Progress Notes (Signed)
10/4/20219:34 AM  Tara Chapman [birth year], 24 y.o., female [id number]  Chief Complaint  Patient presents with   Eye Pain    Patient states she was seen here for her eye with a bump in and utside the eye and the ointment has not been working. Also left breast pain for a couple of weeks and she thought that this pain was maybe because of her period but is still in some pain.    HPI:   Patient is a 24 y.o. 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, housekeeping, when she tries to sleep (right sided side sleep), also when her periods is about to start Denies any lumps, skin changes, LAD, nipple drainage Denies any FHX breast or ovarian cancer LMP sept 9 2021, reports regular menses  Depression screen [facility] 2/9 05/23/2020 03/24/2020 03/09/2020  Decreased Interest 0 0 0  Down, Depressed, Hopeless 0 0 0  PHQ - 2 Score 0 0 0  Altered sleeping - - -  Tired, decreased energy - - -  Change in appetite - - -  Feeling bad or failure about yourself  - - -  Trouble concentrating - - -  Moving slowly or fidgety/restless - - -  Suicidal thoughts - - -  PHQ-9 Score - - -  Some recent data might be hidden    Fall Risk  05/23/2020 03/24/2020 03/09/2020 03/03/2020  Falls in the past year? 0 0 0 0  Number falls in past yr: 0 0 - 0  Injury with Fall? 0 0 - 0  Follow up - Falls evaluation completed Falls evaluation completed -     No Known Allergies  Prior to Admission medications   Medication Sig Start Date End Date Taking? Authorizing Provider  erythromycin ophthalmic ointment Place 1 application into the left eye at bedtime. 03/03/20  Yes Myles Lipps, MD  ondansetron (ZOFRAN ODT) 4 MG disintegrating tablet Take 1 tablet (4 mg total) by mouth every 8  (eight) hours as needed for up to 10 doses for nausea or vomiting. Patient not taking: Reported on 03/24/2020 03/04/20   Sabino Donovan, MD  oxyCODONE (ROXICODONE) 5 MG immediate release tablet Take 1 tablet (5 mg total) by mouth every 6 (six) hours as needed for up to 12 doses for severe pain. Patient not taking: Reported on 03/24/2020 03/04/20   Sabino Donovan, MD  sodium chloride (OCEAN) 0.65 % SOLN nasal spray Place 2 sprays into both nostrils every 2 (two) hours while awake. Patient not taking: Reported on 03/24/2020 01/12/20 03/03/20  Barbaraann Barthel, NP    Past Medical History:  Diagnosis Date   Medical history non-contributory     Past Surgical History:  Procedure Laterality Date   NO PAST SURGERIES      Social History   Tobacco Use   Smoking status: Never Smoker   Smokeless tobacco: Never Used  Substance Use Topics   Alcohol use: No    No family history on file.  ROS  Per hpi  OBJECTIVE:  Today's Vitals   05/23/20 0842  BP: 115/79  Pulse: 81  Resp: 18  Temp: (!) 97.4 F (36.3 C)  TempSrc: Temporal  SpO2: 98%  Weight: 150 lb 9.6 oz (68.3 kg)  Height: 5\' 6"  (1.676 m)   Body mass index is 24.31 kg/m.   Physical Exam Vitals and nursing note reviewed. Exam conducted with a chaperone present.  Constitutional:      Appearance: She is well-developed.  HENT:     Head: Normocephalic and atraumatic.  Eyes:     General: No scleral icterus.       Left eye: Hordeolum present.    Extraocular Movements: Extraocular movements intact.     Conjunctiva/sclera: Conjunctivae normal.     Pupils: Pupils are equal, round, and reactive to light.  Pulmonary:     Effort: Pulmonary effort is normal.  Chest:     Breasts:        Right: No inverted nipple, mass, nipple discharge, skin change or tenderness.        Left: Tenderness (along outer edge of left breast) present. No inverted nipple, mass, nipple discharge or skin change.  Musculoskeletal:     Cervical back: Neck  supple.  Lymphadenopathy:     Upper Body:     Right upper body: No supraclavicular, axillary or pectoral adenopathy.     Left upper body: No supraclavicular, axillary or pectoral adenopathy.  Skin:    General: Skin is warm and dry.  Neurological:     Mental Status: She is alert and oriented to person, place, and time.     No results found for this or any previous visit (from the past 24 hour(s)).  No results found.   ASSESSMENT and PLAN  1. Chalazion of left lower eyelid - Ambulatory referral to Ophthalmology  2. Breast tenderness No masses or skin changes. Patient premenstrual. Discussed monitoring with timing of menses. Consider .  Return in about 3 months (around 08/23/2020) for Just NP - breast tenderness.    10/21/2020, MD Primary Care at [facility] [address] [place], [place] Ph.  [phone] Fax [phone]

## 2020-08-08 ENCOUNTER — Encounter: Payer: 59 | Admitting: Family Medicine

## 2022-05-17 IMAGING — CT CT ABD-PELV W/ CM
2 of 4 series · 15 of 46 positions shown, 17 images · IV contrast (omnipaque)
Comparison: None.

CLINICAL DATA: Right lower quadrant abdominal pain.

EXAM:
CT ABDOMEN AND PELVIS WITH CONTRAST
TECHNIQUE: Multidetector CT imaging of the abdomen and pelvis was performed
using the standard protocol following bolus administration of
intravenous contrast.
CONTRAST:  100mL OMNIPAQUE IOHEXOL 300 MG/ML  SOLN

[Series 2: axial st · axial · 0.67mm/px · z∈[+1078,+1472]mm · 12 of 87 slices shown, 14 images]
[im 4/87  soft-tissue]
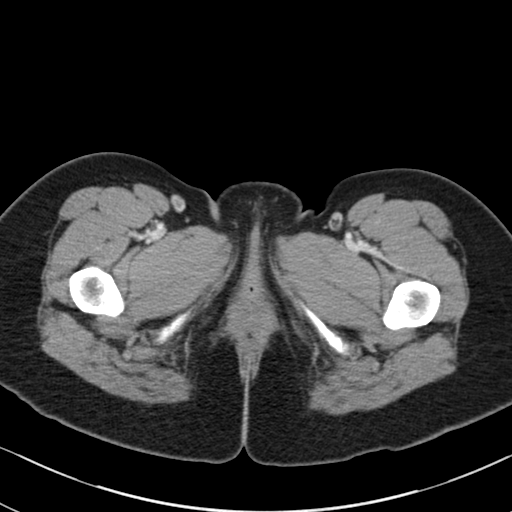
[im 4/87  bone]
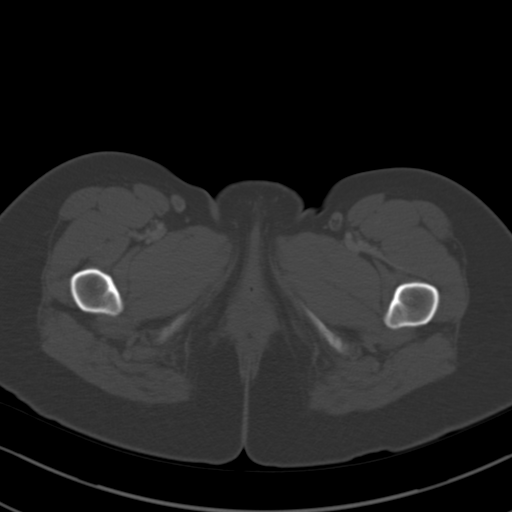
[im 12/87  soft-tissue]
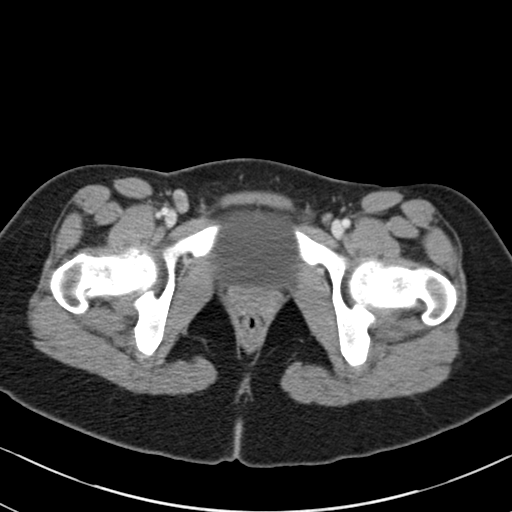
[im 20/87  soft-tissue]
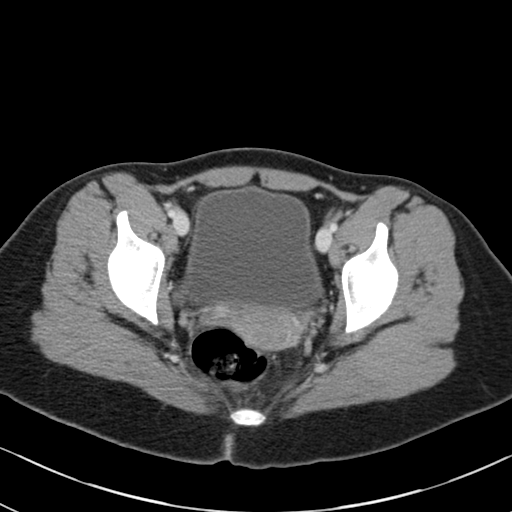
[im 28/87  soft-tissue]
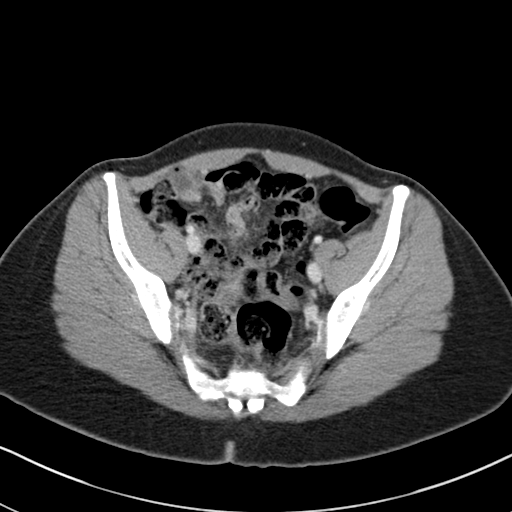
[im 32/87  soft-tissue]
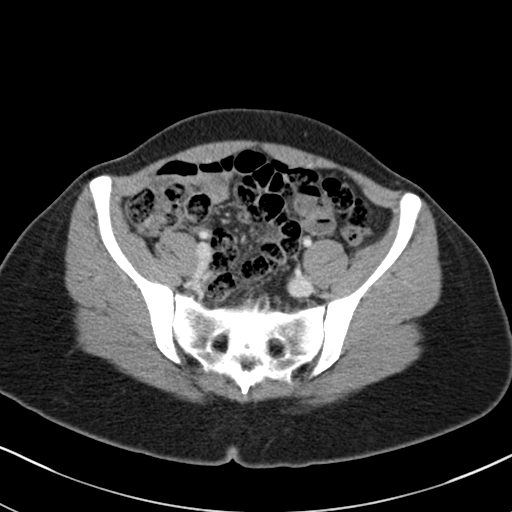
[im 40/87  soft-tissue]
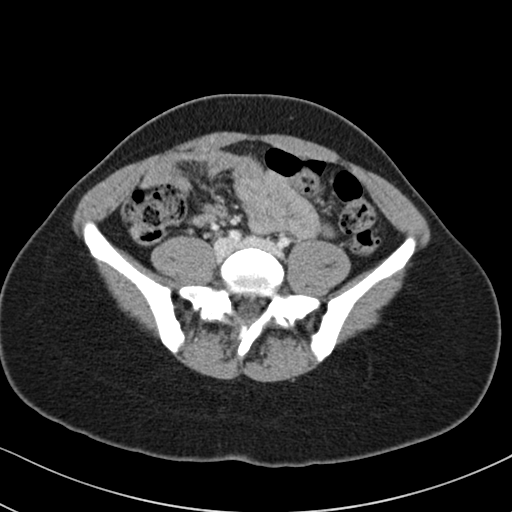
[im 47/87  soft-tissue]
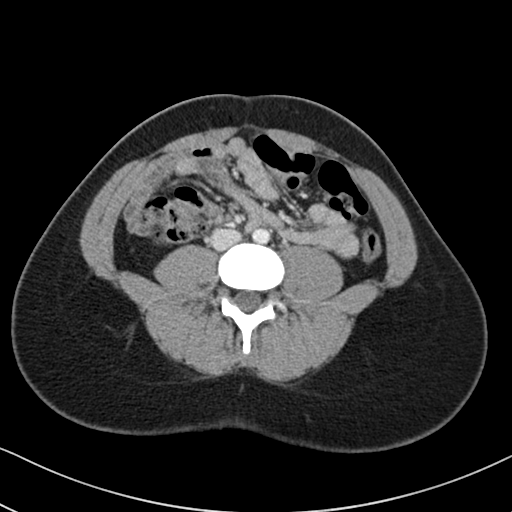
[im 55/87  soft-tissue]
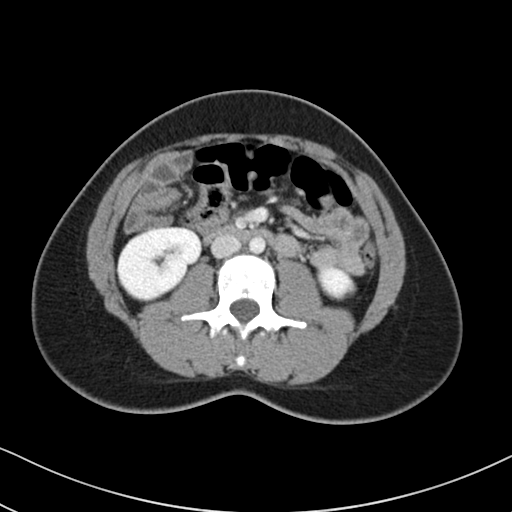
[im 59/87  soft-tissue]
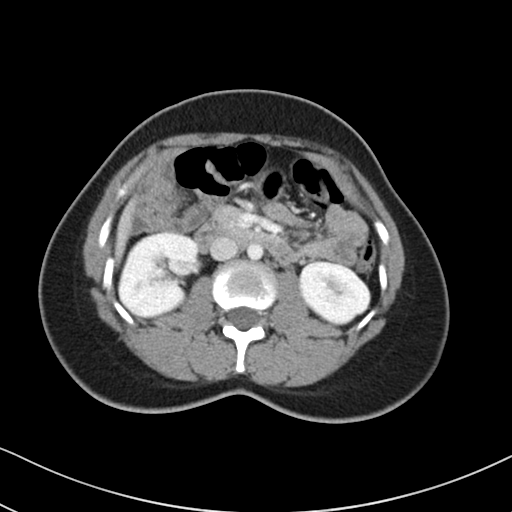
[im 59/87  bone]
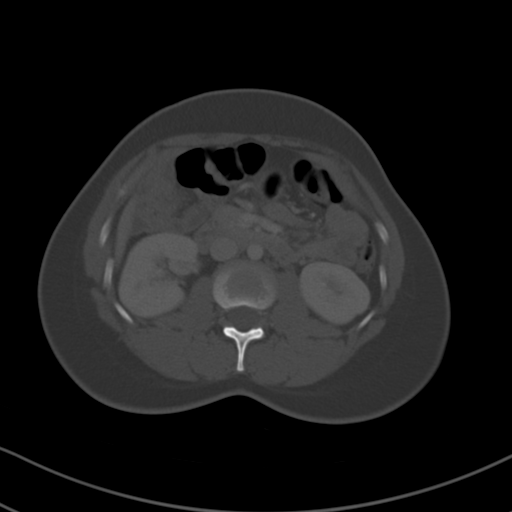
[im 67/87  soft-tissue]
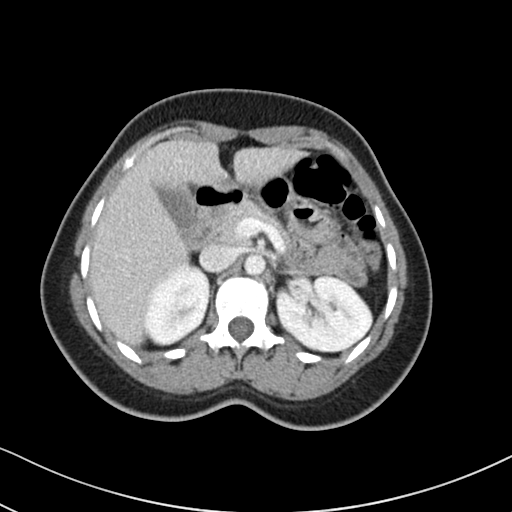
[im 75/87  soft-tissue]
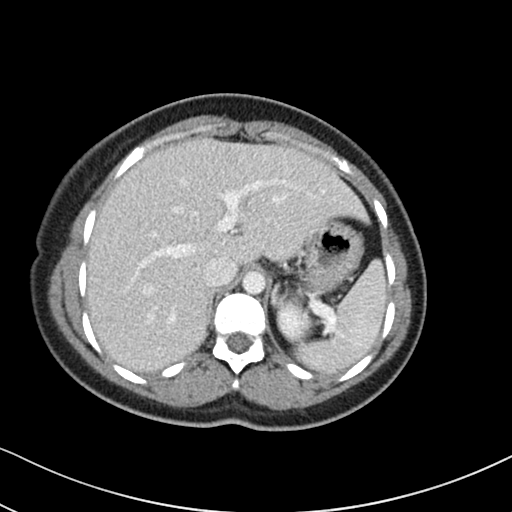
[im 83/87  soft-tissue]
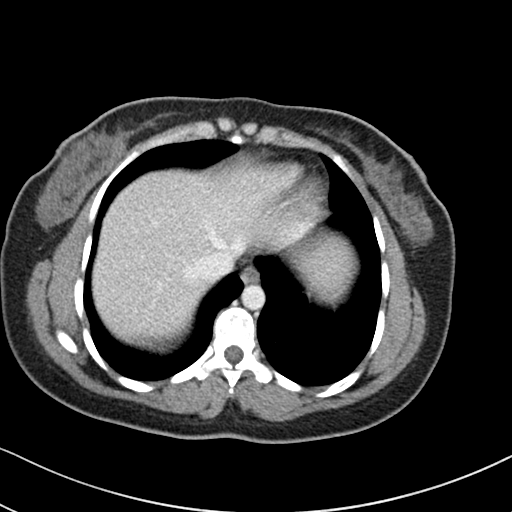

[Series 5: coronal st · coronal · 0.54mm/px · 3 of 133 slices shown]
[im 45/133  soft-tissue]
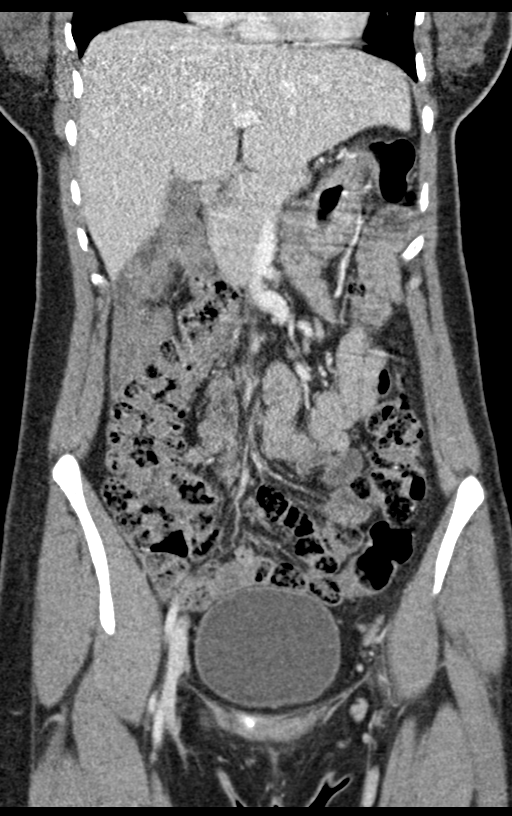
[im 59/133  soft-tissue]
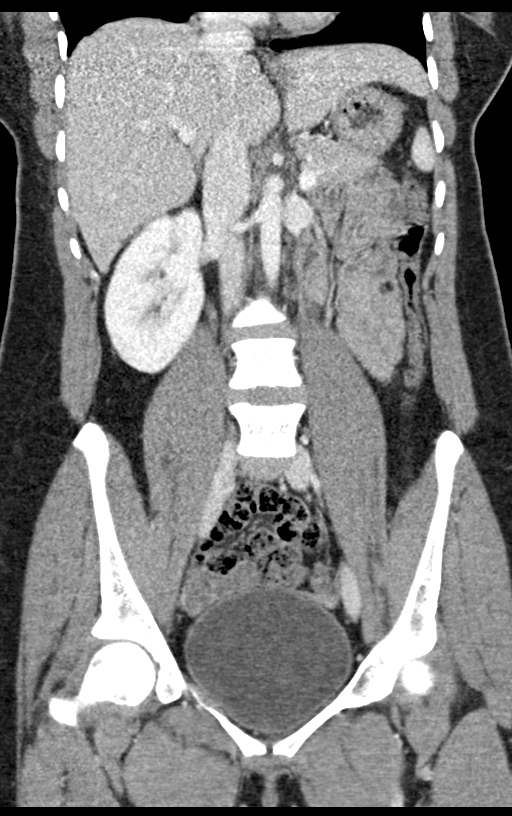
[im 74/133  soft-tissue]
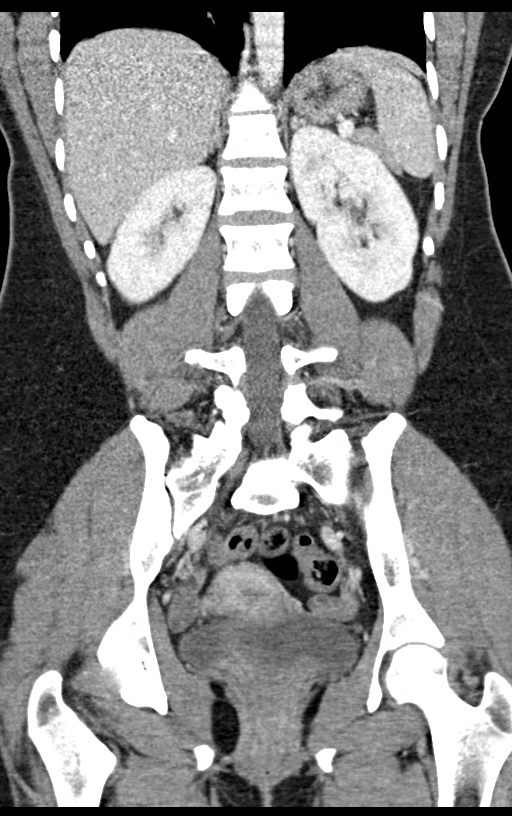

[15 of 46 positions shown; findings below may reference images not displayed]

FINDINGS: Lower chest: The lung bases are clear.

Hepatobiliary: No focal liver abnormality is seen. No gallstones,
gallbladder wall thickening, or biliary dilatation.

Pancreas: No ductal dilatation or inflammation.

Spleen: Normal in size without focal abnormality.

Adrenals/Urinary Tract: Normal adrenal glands. No hydronephrosis or
perinephric edema. Homogeneous renal enhancement. Urinary bladder is
physiologically distended without wall thickening.

Stomach/Bowel: The appendix is normal, for example series 5, image
50.

Stomach is unremarkable. Normal positioning of the duodenum and
ligament of Treitz. There are small bowel loops that project lateral
to the ascending colon there are difficult to accurately delineate
in the absence of enteric contrast, but suspicious for internal
hernia. No evidence of incarceration or ischemia. The terminal ileum
is normal. Moderate volume of stool throughout the colon.

Vascular/Lymphatic: Patent portal vein. No portal or mesenteric
venous gas. The abdominal aorta is normal in caliber. No adenopathy.

Reproductive: Uterus and bilateral adnexa are unremarkable.

Other: No free air, free fluid, or intra-abdominal fluid collection.
Tiny fat containing umbilical hernia.

Musculoskeletal: There are no acute or suspicious osseous
abnormalities.
IMPRESSION: 1. Normal appendix.
2. There are small bowel loops that project lateral to the ascending
colon, difficult to accurately delineate in the absence of enteric
contrast. Findings are suspicious for internal hernia. No evidence
of obstruction, incarceration or ischemia.
3. Moderate colonic stool burden, can be seen with constipation.

## 2022-08-20 NOTE — L&D Delivery Note (Signed)
Delivery Note Tara Chapman Tara Chapman is a 27 y.o. G2P1001 at [redacted]w[redacted]d admitted for augmentation of laboar d/t decreased fetal movement.   GBS Status:     Labor course: Initial SVE: 3.5/80/-2. Augmentation with: AROM. After AROM, her labor quickly picked up.  She then progressed to complete.  ROM: 2h 63m with clear  fluid  Birth: After a 3 contraction  2nd stage, she delivered a Live born female  Birth Weight: pending at time of dictation  APGAR: 8, 9  Newborn Delivery   Birth date/time: 07/26/2025 03:23:00 Delivery type: Vaginal, Spontaneous        Delivered via spontaneous vaginal delivery (Presentation: LOA ). Nuchal cord present: No. . Shoulders and body delivered in usual fashion. Infant placed directly on mom's abdomen for bonding/skin-to-skin, baby dried and stimulated. Cord clamped x 2 after 1 minute and cut by GOB.  Cord blood collected. Placenta delivered-Spontaneous with 3 vessels. 20u Pitocin in 500cc LR given as a bolus prior to delivery of placenta.  Fundus  was slow to firm with massage, and she had some brisk bleeding.  Methergine given IM and TXA IV. Bleeding normalized Placenta inspected and appears to be intact with a 3 VC.  Sponge and instrument count were correct x2.  Intrapartum complications:  None Anesthesia:  epidural Lacerations:  1st degree Suture Repair:  EBL (mL) 719   Mom to postpartum.  Baby to Couplet care / Skin to Skin. Placenta to L&D   Plans to Breastfeed Contraception:  unsure Circumcision: N/A  Note sent to [facility]: N/A, [facility] pt for pp visit.  Delivery Report:  Review the Delivery Report for details.     Signed: Jacklyn Shell, DNP,CNM 07/26/2023, 3:44 AM

## 2022-08-31 ENCOUNTER — Other Ambulatory Visit: Payer: Self-pay | Admitting: Obstetrics and Gynecology

## 2022-08-31 ENCOUNTER — Encounter: Payer: Self-pay | Admitting: Obstetrics and Gynecology

## 2022-08-31 DIAGNOSIS — N979 Female infertility, unspecified: Secondary | ICD-10-CM

## 2022-09-21 DIAGNOSIS — R509 Fever, unspecified: Secondary | ICD-10-CM | POA: Diagnosis not present

## 2022-09-21 DIAGNOSIS — R52 Pain, unspecified: Secondary | ICD-10-CM | POA: Diagnosis not present

## 2022-09-21 DIAGNOSIS — U071 COVID-19: Secondary | ICD-10-CM | POA: Diagnosis not present

## 2022-09-21 DIAGNOSIS — R051 Acute cough: Secondary | ICD-10-CM | POA: Diagnosis not present

## 2022-09-21 DIAGNOSIS — J069 Acute upper respiratory infection, unspecified: Secondary | ICD-10-CM | POA: Diagnosis not present

## 2022-10-08 ENCOUNTER — Encounter: Payer: Self-pay | Admitting: Registered"

## 2022-10-08 ENCOUNTER — Encounter: Payer: 59 | Attending: Obstetrics and Gynecology | Admitting: Registered"

## 2022-10-08 DIAGNOSIS — R7303 Prediabetes: Secondary | ICD-10-CM | POA: Insufficient documentation

## 2022-10-08 NOTE — Patient Instructions (Addendum)
Drink more water Aim to eat balanced meals and snacks Whole grains Don't skip lunch Walk on days off 15-20 min. Be sure to continue to have annual wellness exams

## 2022-10-08 NOTE — Progress Notes (Signed)
Medical Nutrition Therapy  Appointment Start time:  [phone]  Appointment End time:  0905  Primary concerns today: does not want to develop diabetes  Referral diagnosis: prediabetes R73.03 Preferred learning style: no preference indicated Learning readiness: ready, change in progress  NUTRITION ASSESSMENT  Anthropometrics  Not assessed this visit   Clinical Medical Hx: reviewed Medications: none Labs: A1c 6.0 Notable Signs/Symptoms: weight gain  Lifestyle & Dietary Hx Pt states since dx she has cut out sugar but has been craving chocolate at work. Pt states all of her family has diabetes. Pt states recently people have told her that they can tell she is gaining weight.   Pt reports other concern - she was having a lot of pain in the left finger, right small toe. Pain has gone away. Pt states she has not reported to her doctor. Pt states she has cut out sugar and has started losing weight.   Pt states when she was pregnant only complication notes was itching all over her body but was resolved with medication.  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  As evidenced by pt reported changes in diet to control blood sugar, only focused on eliminating sugar and skips meals to eat less.  NUTRITION INTERVENTION  Nutrition education (E-1) on the following topics:  MyPlate A1c Prenatal vitamin Risk for GDM  Handouts Provided Include  MyPlate in Arabic [id number] chart  Learning Style & Readiness  for Change Teaching method utilized: Visual & Auditory  Demonstrated degree of understanding via: Teach Back  Barriers to learning/adherence to lifestyle change: none  Goals Established by Pt Drink more water Aim to eat balanced meals and snacks Whole grains Don't skip lunch Walk on days off 15-20 min. Be sure to continue to have annual wellness exams   MONITORING & EVALUATION Dietary intake, weekly physical activity, and sleep in 6-8 weeks.

## 2022-10-11 DIAGNOSIS — Z03818 Encounter for observation for suspected exposure to other biological agents ruled out: Secondary | ICD-10-CM | POA: Diagnosis not present

## 2022-10-11 DIAGNOSIS — J039 Acute tonsillitis, unspecified: Secondary | ICD-10-CM | POA: Diagnosis not present

## 2022-10-11 DIAGNOSIS — J029 Acute pharyngitis, unspecified: Secondary | ICD-10-CM | POA: Diagnosis not present

## 2022-10-11 DIAGNOSIS — R52 Pain, unspecified: Secondary | ICD-10-CM | POA: Diagnosis not present

## 2022-11-26 ENCOUNTER — Ambulatory Visit: Payer: 59 | Admitting: Registered"

## 2022-11-26 NOTE — Progress Notes (Deleted)
Medical Nutrition Therapy  Appointment Start time:  0805  Appointment End time:  0905  Primary concerns today: does not want to develop diabetes  Referral diagnosis: prediabetes R73.03 Preferred learning style: no preference indicated Learning readiness: ready, change in progress  NUTRITION ASSESSMENT  Anthropometrics  Not assessed this visit   Clinical Medical Hx: reviewed Medications: none Labs: A1c 6.0 Notable Signs/Symptoms: weight gain  Lifestyle & Dietary Hx Pt states since dx she has cut out sugar but has been craving chocolate at work. Pt states all of her family has diabetes. Pt states recently people have told her that they can tell she is gaining weight.   Pt reports other concern - she was having a lot of pain in the left finger, right small toe. Pain has gone away. Pt states she has not reported to her doctor. Pt states she has cut out sugar and has started losing weight.   Pt states when she was pregnant only complication notes was itching all over her body but was resolved with medication.  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  As evidenced by pt reported changes in diet to control blood sugar, only focused on eliminating sugar and skips meals to eat less.  NUTRITION INTERVENTION  Nutrition education (E-1) on the following topics:  MyPlate A1c Prenatal vitamin Risk for GDM  Handouts Provided Include  MyPlate in Arabic A1c chart  Learning Style & Readiness  for Change Teaching method utilized: Visual & Auditory  Demonstrated degree of understanding via: Teach Back  Barriers to learning/adherence to lifestyle change: none  Goals Established by Pt Drink more water Aim to eat balanced meals and snacks Whole grains Don't skip lunch Walk on days off 15-20 min. Be sure to continue to have annual wellness exams   MONITORING & EVALUATION Dietary intake, weekly physical activity, and sleep in 6-8 weeks.  

## 2022-12-22 ENCOUNTER — Inpatient Hospital Stay (HOSPITAL_COMMUNITY): Payer: Medicaid Other

## 2022-12-22 ENCOUNTER — Encounter (HOSPITAL_COMMUNITY): Payer: Self-pay

## 2022-12-22 ENCOUNTER — Inpatient Hospital Stay (HOSPITAL_COMMUNITY)
Admission: AD | Admit: 2022-12-22 | Discharge: 2022-12-22 | Disposition: A | Discharge status: Home / Self Care | Payer: Medicaid Other | Attending: Obstetrics | Admitting: Obstetrics

## 2022-12-22 DIAGNOSIS — Z3A08 8 weeks gestation of pregnancy: Secondary | ICD-10-CM | POA: Insufficient documentation

## 2022-12-22 DIAGNOSIS — O219 Vomiting of pregnancy, unspecified: Secondary | ICD-10-CM | POA: Diagnosis not present

## 2022-12-22 DIAGNOSIS — O26891 Other specified pregnancy related conditions, first trimester: Secondary | ICD-10-CM | POA: Insufficient documentation

## 2022-12-22 DIAGNOSIS — R1032 Left lower quadrant pain: Secondary | ICD-10-CM | POA: Diagnosis not present

## 2022-12-22 DIAGNOSIS — Z603 Acculturation difficulty: Secondary | ICD-10-CM

## 2022-12-22 DIAGNOSIS — Z3490 Encounter for supervision of normal pregnancy, unspecified, unspecified trimester: Secondary | ICD-10-CM

## 2022-12-22 LAB — CBC
HCT: 36.1 % (ref 36.0–46.0)
Hemoglobin: 12.4 g/dL (ref 12.0–15.0)
MCH: 31.4 pg (ref 26.0–34.0)
MCHC: 34.3 g/dL (ref 30.0–36.0)
MCV: 91.4 fL (ref 80.0–100.0)
Platelets: 429 10*3/uL — ABNORMAL HIGH (ref 150–400)
RBC: 3.95 MIL/uL (ref 3.87–5.11)
RDW: 12.2 % (ref 11.5–15.5)
WBC: 10.6 10*3/uL — ABNORMAL HIGH (ref 4.0–10.5)
nRBC: 0 % (ref 0.0–0.2)

## 2022-12-22 LAB — COMPREHENSIVE METABOLIC PANEL
ALT: 22 U/L (ref 0–44)
AST: 19 U/L (ref 15–41)
Albumin: 3.6 g/dL (ref 3.5–5.0)
Alkaline Phosphatase: 41 U/L (ref 38–126)
Anion gap: 9 (ref 5–15)
BUN: 7 mg/dL (ref 6–20)
CO2: 24 mmol/L (ref 22–32)
Calcium: 9.3 mg/dL (ref 8.9–10.3)
Chloride: 100 mmol/L (ref 98–111)
Creatinine, Ser: 0.58 mg/dL (ref 0.44–1.00)
GFR, Estimated: >60 mL/min (ref >60)
Glucose, Bld: 94 mg/dL (ref 70–99)
Potassium: 3.7 mmol/L (ref 3.5–5.1)
Sodium: 133 mmol/L — ABNORMAL LOW (ref 135–145)
Total Bilirubin: 0.3 mg/dL (ref 0.3–1.2)
Total Protein: 7.8 g/dL (ref 6.5–8.1)

## 2022-12-22 LAB — URINALYSIS, ROUTINE W REFLEX MICROSCOPIC
Bacteria, UA: NONE SEEN
Bilirubin Urine: NEGATIVE
Glucose, UA: NEGATIVE mg/dL
Hgb urine dipstick: NEGATIVE
Ketones, ur: NEGATIVE mg/dL
Nitrite: NEGATIVE
Protein, ur: NEGATIVE mg/dL
Specific Gravity, Urine: 1.026 (ref 1.005–1.030)
pH: 7 (ref 5.0–8.0)

## 2022-12-22 LAB — POCT PREGNANCY, URINE: Preg Test, Ur: POSITIVE — AB

## 2022-12-22 MED ORDER — PREPLUS 27-1 MG PO TABS: 1.0000 | ORAL_TABLET | Freq: Every day | ORAL | 13 refills | Status: AC

## 2022-12-22 NOTE — MAU Provider Note (Signed)
History     CSN: [id number]  Arrival date and time: 12/22/22 2126   Event Date/Time   First Provider Initiated Contact with Patient 12/22/22 2341      Chief Complaint  Patient presents with   Abdominal Pain   Nausea   Emesis   HPI Tara Chapman is a 27 y.o. at [redacted]w[redacted]d by LMP who presents to MAU with chief complaints of abdominal pain, nausea and vomiting. Patient states she has vomited about three times today. She is consistently able to tolerate water and fresh mango. She does not have access to antiemetic medication.  Patient's abdominal pain is LLQ and radiates it her left lower back. Pain worsens when she sits down. She denies vaginal bleeding, dysuria, fever, falls, or recent illness.   OB History     Gravida  2   Para  1   Term  1   Preterm      AB      Living  1      SAB      IAB      Ectopic      Multiple  0   Live Births  1           Past Medical History:  Diagnosis Date   Medical history non-contributory     Past Surgical History:  Procedure Laterality Date   NO PAST SURGERIES      No family history on file.  Social History   Tobacco Use   Smoking status: Never   Smokeless tobacco: Never  Substance Use Topics   Alcohol use: No   Drug use: No    Allergies: No Known Allergies  Medications Prior to Admission  Medication Sig Dispense Refill Last Dose   erythromycin ophthalmic ointment Place 1 application into the left eye at bedtime. 3.5 g 0     Review of Systems  Gastrointestinal:  Positive for abdominal pain, nausea and vomiting.  All other systems reviewed and are negative.  Physical Exam   Blood pressure 111/71, pulse 79, temperature 98.1 F (36.7 C), temperature source Oral, resp. rate 16, height 5\' 6"  (1.676 m), weight 70.1 kg, last menstrual period 11/25/2022, SpO2 100 %, unknown if currently breastfeeding.  Physical Exam Vitals and nursing note reviewed. Exam conducted with a chaperone present.   Constitutional:      Appearance: She is well-developed. She is not ill-appearing.  Cardiovascular:     Rate and Rhythm: Normal rate and regular rhythm.     Heart sounds: Normal heart sounds.  Pulmonary:     Effort: Pulmonary effort is normal.     Breath sounds: Normal breath sounds.  Abdominal:     Palpations: Abdomen is soft.     Tenderness: There is no abdominal tenderness.  Skin:    Capillary Refill: Capillary refill takes less than 2 seconds.  Neurological:     Mental Status: She is alert and oriented to person, place, and time.  Psychiatric:        Mood and Affect: Mood normal.        Behavior: Behavior normal.     MAU Course  Procedures  MDM  Orders Placed This Encounter  Procedures   US OB Comp Less 14 Wks   Urinalysis, Routine w reflex microscopic -Urine, Clean Catch   CBC   hCG, quantitative, pregnancy   Comprehensive metabolic panel   Diet NPO time specified   Pregnancy, urine POC   Discharge patient   Patient Vitals for the  past 24 hrs:  BP Temp Temp src Pulse Resp SpO2 Height Weight  12/22/22 2206 111/71 98.1 F (36.7 C) Oral 79 16 100 % 5\' 6"  (1.676 m) 70.1 kg   Results for orders placed or performed during the hospital encounter of 12/22/22 (from the past 24 hour(s))  Urinalysis, Routine w reflex microscopic -Urine, Clean Catch     Status: Abnormal   Collection Time: 12/22/22 10:15 PM  Result Value Ref Range   Color, Urine YELLOW YELLOW   APPearance HAZY (A) CLEAR   Specific Gravity, Urine 1.026 1.005 - 1.030   pH 7.0 5.0 - 8.0   Glucose, UA NEGATIVE NEGATIVE mg/dL   Hgb urine dipstick NEGATIVE NEGATIVE   Bilirubin Urine NEGATIVE NEGATIVE   Ketones, ur NEGATIVE NEGATIVE mg/dL   Protein, ur NEGATIVE NEGATIVE mg/dL   Nitrite NEGATIVE NEGATIVE   Leukocytes,Ua TRACE (A) NEGATIVE   RBC / HPF 11-20 0 - 5 RBC/hpf   WBC, UA 6-10 0 - 5 WBC/hpf   Bacteria, UA NONE SEEN NONE SEEN   Squamous Epithelial / HPF 0-5 0 - 5 /HPF   Mucus PRESENT   Pregnancy,  urine POC     Status: Abnormal   Collection Time: 12/22/22 10:17 PM  Result Value Ref Range   Preg Test, Ur POSITIVE (A) NEGATIVE  CBC     Status: Abnormal   Collection Time: 12/22/22 11:09 PM  Result Value Ref Range   WBC 10.6 (H) 4.0 - 10.5 K/uL   RBC 3.95 3.87 - 5.11 MIL/uL   Hemoglobin 12.4 12.0 - 15.0 g/dL   HCT 16.1 09.6 - 04.5 %   MCV 91.4 80.0 - 100.0 fL   MCH 31.4 26.0 - 34.0 pg   MCHC 34.3 30.0 - 36.0 g/dL   RDW 40.9 81.1 - 91.4 %   Platelets 429 (H) 150 - 400 K/uL   nRBC 0.0 0.0 - 0.2 %  hCG, quantitative, pregnancy     Status: Abnormal   Collection Time: 12/22/22 11:09 PM  Result Value Ref Range   hCG, Beta Chain, Quant, S 226,549 (H) <5 mIU/mL  Comprehensive metabolic panel     Status: Abnormal   Collection Time: 12/22/22 11:09 PM  Result Value Ref Range   Sodium 133 (L) 135 - 145 mmol/L   Potassium 3.7 3.5 - 5.1 mmol/L   Chloride 100 98 - 111 mmol/L   CO2 24 22 - 32 mmol/L   Glucose, Bld 94 70 - 99 mg/dL   BUN 7 6 - 20 mg/dL   Creatinine, Ser 7.82 0.44 - 1.00 mg/dL   Calcium 9.3 8.9 - 95.6 mg/dL   Total Protein 7.8 6.5 - 8.1 g/dL   Albumin 3.6 3.5 - 5.0 g/dL   AST 19 15 - 41 U/L   ALT 22 0 - 44 U/L   Alkaline Phosphatase 41 38 - 126 U/L   Total Bilirubin 0.3 0.3 - 1.2 mg/dL   GFR, Estimated >21 >30 mL/min   Anion gap 9 5 - 15   US OB Comp Less 14 Wks  Result Date: 12/22/2022 CLINICAL DATA:  Pelvic pain. No vaginal bleeding. Beta HCG pending. LMP 11/25/2022. EXAM: OBSTETRIC <14 WK ULTRASOUND TECHNIQUE: Transabdominal ultrasound was performed for evaluation of the gestation as well as the maternal uterus and adnexal regions. COMPARISON:  None Available. FINDINGS: Intrauterine gestational sac: Single Yolk sac:  Visualized. Embryo:  Visualized. Cardiac Activity: Visualized. Heart Rate: 174 bpm CRL:   19.9 mm   8 w 4 d  [place] EDC: 07/30/2023 Subchorionic hemorrhage:  None visualized. Maternal uterus/adnexae: Normal ovaries.  No free fluid. IMPRESSION:  Single living intrauterine gestation with crown-rump length of 19.9 mm corresponding with an 8 week 4 day gestational age. Electronically Signed   By: Minerva Fester M.D.   On: 12/22/2022 23:38    Assessment and Plan  --27 y.o. G2P1001 with [facility] at [redacted]w[redacted]d  --Vomiting without ketonuria --Language barrier: patient declined interpreter --Discharge home in stable condition with first trimester precautions  Calvert Cantor, MSA, MSN, CNM 12/22/2022, 4:21 AM

## 2022-12-22 NOTE — MAU Note (Signed)
..  Tara Chapman is a 27 y.o. at Unknown here in MAU reporting: Reports lower abdominal bloating and intermittent pain, burning with urination. Reports n/v has vomited 3 times today.  Positive pregnancy test at home, has first prenatal scheduled.  LMP: 11/25/2022 Onset of complaint:  Pain score: 8/10 Vitals:   12/22/22 2206  BP: 111/71  Pulse: 79  Resp: 16  Temp: 98.1 F (36.7 C)  SpO2: 100%   Lab orders placed from triage:  POCT pregnancy

## 2022-12-23 LAB — HCG, QUANTITATIVE, PREGNANCY: hCG, Beta Chain, Quant, S: 226549 m[IU]/mL — ABNORMAL HIGH (ref <5)

## 2023-01-23 ENCOUNTER — Inpatient Hospital Stay (HOSPITAL_COMMUNITY)
Admission: AD | Admit: 2023-01-23 | Discharge: 2023-01-23 | Disposition: A | Discharge status: Home / Self Care | Payer: Medicaid Other | Attending: Obstetrics and Gynecology | Admitting: Obstetrics and Gynecology

## 2023-01-23 ENCOUNTER — Encounter (HOSPITAL_COMMUNITY): Payer: Self-pay | Admitting: Obstetrics and Gynecology

## 2023-01-23 DIAGNOSIS — M545 Low back pain, unspecified: Secondary | ICD-10-CM | POA: Diagnosis not present

## 2023-01-23 DIAGNOSIS — O99611 Diseases of the digestive system complicating pregnancy, first trimester: Secondary | ICD-10-CM | POA: Diagnosis not present

## 2023-01-23 DIAGNOSIS — R519 Headache, unspecified: Secondary | ICD-10-CM | POA: Diagnosis not present

## 2023-01-23 DIAGNOSIS — K5900 Constipation, unspecified: Secondary | ICD-10-CM | POA: Diagnosis not present

## 2023-01-23 DIAGNOSIS — R309 Painful micturition, unspecified: Secondary | ICD-10-CM | POA: Diagnosis not present

## 2023-01-23 DIAGNOSIS — M549 Dorsalgia, unspecified: Secondary | ICD-10-CM

## 2023-01-23 DIAGNOSIS — O26891 Other specified pregnancy related conditions, first trimester: Secondary | ICD-10-CM | POA: Insufficient documentation

## 2023-01-23 DIAGNOSIS — R42 Dizziness and giddiness: Secondary | ICD-10-CM | POA: Diagnosis not present

## 2023-01-23 DIAGNOSIS — O99891 Other specified diseases and conditions complicating pregnancy: Secondary | ICD-10-CM | POA: Diagnosis not present

## 2023-01-23 DIAGNOSIS — O99612 Diseases of the digestive system complicating pregnancy, second trimester: Secondary | ICD-10-CM | POA: Insufficient documentation

## 2023-01-23 DIAGNOSIS — Z3A13 13 weeks gestation of pregnancy: Secondary | ICD-10-CM | POA: Insufficient documentation

## 2023-01-23 LAB — URINALYSIS, ROUTINE W REFLEX MICROSCOPIC
Bilirubin Urine: NEGATIVE
Glucose, UA: NEGATIVE mg/dL
Hgb urine dipstick: NEGATIVE
Ketones, ur: NEGATIVE mg/dL
Leukocytes,Ua: NEGATIVE
Nitrite: NEGATIVE
Protein, ur: NEGATIVE mg/dL
Specific Gravity, Urine: 1.026 (ref 1.005–1.030)
pH: 7 (ref 5.0–8.0)

## 2023-01-23 LAB — WET PREP, GENITAL
Clue Cells Wet Prep HPF POC: NONE SEEN
Sperm: NONE SEEN
Trich, Wet Prep: NONE SEEN
WBC, Wet Prep HPF POC: <10 (ref <10)
Yeast Wet Prep HPF POC: NONE SEEN

## 2023-01-23 MED ORDER — DOCUSATE SODIUM 250 MG PO CAPS: 250.0000 mg | ORAL_CAPSULE | Freq: Every day | ORAL | 0 refills | Status: DC

## 2023-01-23 MED ORDER — ACETAMINOPHEN 500 MG PO TABS
1000.0000 mg | ORAL_TABLET | Freq: Once | ORAL | Status: AC
  Administered 2023-01-23: 1000 mg via ORAL
  Filled 2023-01-23: qty 2

## 2023-01-23 MED ORDER — POLYETHYLENE GLYCOL 3350 17 G PO PACK: 17.0000 g | PACK | Freq: Every day | ORAL | 0 refills | Status: DC

## 2023-01-23 NOTE — MAU Note (Signed)
.  Tara Chapman Jetzabel Lassere is a 27 y.o. at [redacted]w[redacted]d here in MAU reporting: back pain since Saturday that radiated towards her abd.  Denies any vag bleeding or discharge. Pt reports also having a headache that affects her vision. Has not taken anything for headache LMP:  Onset of complaint: Sat Pain score: 8 Vitals:   01/23/23 1252  BP: 121/67  Pulse: 87  Resp: 18  Temp: 98.2 F (36.8 C)     FHT:160 Lab orders placed from triage:  U/A

## 2023-01-23 NOTE — Discharge Instructions (Signed)

## 2023-01-23 NOTE — MAU Provider Note (Signed)
History     CSN: [id number]  Arrival date and time: 01/23/23 1235   None     Chief Complaint  Patient presents with   Back Pain   HPI Tara Chapman is a 27 y.o. G2P1001 at [redacted]w[redacted]d who presents to MAU for multiple complaints.   Back pain Patient reports low back pain that has been ongoing since Saturday. 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 She was working Saturday when the pain started. The pain makes it difficult to sit, stand for long periods, and walk. She reports sitting up helps but lying on her sides makes the pain worse. She has not take anything to relieve the pain as she wanted to discuss with a doctor before taking anything. She denies vaginal bleeding or abnormal discharge. No vaginal odor but often "feels heat down there after intercourse" which she reports is bothersome to her. She reports some burning and pain with urination although this has been "on and off for awhile". No fever or chills  Headache She reports headache behind her eyes. Headaches have been more frequent since pregnancy. She reports she sometimes gets dizzy from the headaches. Lights often aggravate the headache. No history of headaches or migraines. She has not taken anything to relieve the pain.   Bloating Patient is also concerned about bloating in her abdomen since pregnancy. She reports she often does not eat because it makes her feel bloated and she does not like the way it makes her feels. She denies nausea and vomiting but reports a lot of constipation. She reports her BMs have been difficult and hard. She is passing gas. She has not taken anything to relieve constipation.   Patient reports she went somewhere to start her [facility] however was told to make a follow up appointment once her Medicaid is approved. She cannot remember the name of the clinic  OB History     Gravida  2   Para  1   Term  1   Preterm      AB      Living  1       SAB      IAB      Ectopic      Multiple  0   Live Births  1           Past Medical History:  Diagnosis Date   Medical history non-contributory     Past Surgical History:  Procedure Laterality Date   NO PAST SURGERIES      No family history on file.  Social History   Tobacco Use   Smoking status: Never   Smokeless tobacco: Never  Substance Use Topics   Alcohol use: No   Drug use: No    Allergies: No Known Allergies  Medications Prior to Admission  Medication Sig Dispense Refill Last Dose   Prenatal Vit-Fe Fumarate-FA (PREPLUS) 27-1 MG TABS Take 1 tablet by mouth daily. 30 tablet 13 Past Week   Review of Systems  Gastrointestinal:  Positive for constipation.       Bloating   Genitourinary:  Positive for dysuria and vaginal discharge.       Vaginal odor  Musculoskeletal:  Positive for back pain.  Neurological:  Positive for headaches.   Physical Exam   Blood pressure 121/67, pulse 87, temperature 98.2 F (36.8 C), resp. rate 18, last menstrual period 11/25/2022, unknown if currently breastfeeding.  Physical Exam Vitals and nursing  note reviewed. Exam conducted with a chaperone present.  Constitutional:      General: She is not in acute distress. Eyes:     Extraocular Movements: Extraocular movements intact.     Pupils: Pupils are equal, round, and reactive to light.  Cardiovascular:     Rate and Rhythm: Normal rate.  Pulmonary:     Effort: Pulmonary effort is normal. No respiratory distress.  Abdominal:     Palpations: Abdomen is soft.     Tenderness: There is no abdominal tenderness. There is no right CVA tenderness or left CVA tenderness.  Genitourinary:    Comments: Blind swabs collected Bimanual: large amount of stool palpated  Musculoskeletal:        General: Normal range of motion.     Cervical back: Normal range of motion.  Skin:    General: Skin is dry.  Neurological:     General: No focal deficit present.     Mental Status:  She is alert and oriented to person, place, and time.  Psychiatric:        Mood and Affect: Mood normal.        Behavior: Behavior normal.   Dilation: Closed Exam by:: D. Thalia Turkington, CNM  FHR: 160 bpm via doppler  Results for orders placed or performed during the hospital encounter of 01/23/23 (from the past 24 hour(s))  Wet prep, genital     Status: None   Collection Time: 01/23/23  1:38 PM   Specimen: Vaginal  Result Value Ref Range   Yeast Wet Prep HPF POC NONE SEEN NONE SEEN   Trich, Wet Prep NONE SEEN NONE SEEN   Clue Cells Wet Prep HPF POC NONE SEEN NONE SEEN   WBC, Wet Prep HPF POC <10 <10   Sperm NONE SEEN   Urinalysis, Routine w reflex microscopic -Urine, Clean Catch     Status: Abnormal   Collection Time: 01/23/23  2:25 PM  Result Value Ref Range   Color, Urine YELLOW YELLOW   APPearance HAZY (A) CLEAR   Specific Gravity, Urine 1.026 1.005 - 1.030   pH 7.0 5.0 - 8.0   Glucose, UA NEGATIVE NEGATIVE mg/dL   Hgb urine dipstick NEGATIVE NEGATIVE   Bilirubin Urine NEGATIVE NEGATIVE   Ketones, ur NEGATIVE NEGATIVE mg/dL   Protein, ur NEGATIVE NEGATIVE mg/dL   Nitrite NEGATIVE NEGATIVE   Leukocytes,Ua NEGATIVE NEGATIVE    MAU Course  Procedures  MDM UA Wet prep, GC/CT Tylenol  UA and wet prep negative. GC/CT pending. Headache and back pain improved with Tylenol.  Arabic interpretor used for entirety of today's visit  Assessment and Plan   1. Back pain affecting pregnancy in second trimester   2. Pregnancy headache in second trimester   3. Constipation during pregnancy in second trimester   4. [redacted] weeks gestation of pregnancy    - Discharge home in stable condition - Rx for Miralax and Colace per patient request - Reviewed increase water and fiber intake. Tylenol/heat for back pain - Return precautions given. Return to MAU as needed - Follow up with OB  Brand Males, CNM 01/23/2023, 3:29 PM

## 2023-01-24 LAB — GC/CHLAMYDIA PROBE AMP (~~LOC~~) NOT AT ARMC
Chlamydia: NEGATIVE
Comment: NEGATIVE
Comment: NORMAL
Neisseria Gonorrhea: NEGATIVE

## 2023-03-09 ENCOUNTER — Inpatient Hospital Stay (HOSPITAL_COMMUNITY)
Admission: AD | Admit: 2023-03-09 | Discharge: 2023-03-10 | Disposition: A | Discharge status: Home / Self Care | Payer: Medicaid Other | Attending: Obstetrics and Gynecology | Admitting: Obstetrics and Gynecology

## 2023-03-09 DIAGNOSIS — K219 Gastro-esophageal reflux disease without esophagitis: Secondary | ICD-10-CM | POA: Insufficient documentation

## 2023-03-09 DIAGNOSIS — O99612 Diseases of the digestive system complicating pregnancy, second trimester: Secondary | ICD-10-CM | POA: Diagnosis not present

## 2023-03-09 DIAGNOSIS — O219 Vomiting of pregnancy, unspecified: Secondary | ICD-10-CM | POA: Insufficient documentation

## 2023-03-09 DIAGNOSIS — Z3A19 19 weeks gestation of pregnancy: Secondary | ICD-10-CM | POA: Diagnosis not present

## 2023-03-09 LAB — URINALYSIS, ROUTINE W REFLEX MICROSCOPIC
Bilirubin Urine: NEGATIVE
Glucose, UA: NEGATIVE mg/dL
Hgb urine dipstick: NEGATIVE
Ketones, ur: NEGATIVE mg/dL
Nitrite: NEGATIVE
Protein, ur: NEGATIVE mg/dL
Specific Gravity, Urine: 1.011 (ref 1.005–1.030)
pH: 7 (ref 5.0–8.0)

## 2023-03-09 MED ORDER — FAMOTIDINE 20 MG PO TABS: 20.0000 mg | ORAL_TABLET | Freq: Two times a day (BID) | ORAL | 1 refills | Status: DC

## 2023-03-09 MED ORDER — ALUM & MAG HYDROXIDE-SIMETH 200-200-20 MG/5ML PO SUSP
15.0000 mL | Freq: Once | ORAL | Status: AC
  Administered 2023-03-09: 15 mL via ORAL
  Filled 2023-03-09: qty 30

## 2023-03-09 MED ORDER — ONDANSETRON 4 MG PO TBDP
8.0000 mg | ORAL_TABLET | Freq: Once | ORAL | Status: AC
  Administered 2023-03-09: 8 mg via ORAL
  Filled 2023-03-09: qty 2

## 2023-03-09 MED ORDER — ONDANSETRON HCL 8 MG PO TABS: 8.0000 mg | ORAL_TABLET | Freq: Once | ORAL | 0 refills | Status: AC

## 2023-03-09 MED ORDER — FAMOTIDINE 20 MG PO TABS
40.0000 mg | ORAL_TABLET | Freq: Once | ORAL | Status: AC
  Administered 2023-03-09: 40 mg via ORAL
  Filled 2023-03-09: qty 2

## 2023-03-09 NOTE — MAU Provider Note (Signed)
  History     CSN: [id number]  Arrival date and time: 03/09/23 2044   Event Date/Time   First Provider Initiated Contact with Patient 03/09/23 2236      Chief Complaint  Patient presents with   Emesis During Pregnancy   HPI  Reports nausea and vomiting and GERD symptoms Has vomitied 2 x today.  Does not feel dehydrated. Wanted to know why she has burning in her throat and upper stomach. Has not taken anything over the counter for the symptoms.   OB History     Gravida  2   Para  1   Term  1   Preterm      AB      Living  1      SAB      IAB      Ectopic      Multiple  0   Live Births  1           Past Medical History:  Diagnosis Date   Medical history non-contributory     Past Surgical History:  Procedure Laterality Date   NO PAST SURGERIES      No family history on file.  Social History   Tobacco Use   Smoking status: Never   Smokeless tobacco: Never  Substance Use Topics   Alcohol use: No   Drug use: No    Allergies: No Known Allergies  Medications Prior to Admission  Medication Sig Dispense Refill Last Dose   docusate sodium (COLACE) 250 MG capsule Take 1 capsule (250 mg total) by mouth daily. (Patient not taking: Reported on 03/09/2023) 10 capsule 0 Not Taking   polyethylene glycol (MIRALAX) 17 g packet Take 17 g by mouth daily. (Patient not taking: Reported on 03/09/2023) 14 each 0 Not Taking   Prenatal Vit-Fe Fumarate-FA (PREPLUS) 27-1 MG TABS Take 1 tablet by mouth daily. (Patient not taking: Reported on 03/09/2023) 30 tablet 13 Not Taking   Results for orders placed or performed during the hospital encounter of 03/09/23 (from the past 48 hour(s))  Urinalysis, Routine w reflex microscopic -Urine, Clean Catch     Status: Abnormal   Collection Time: 03/09/23  9:13 PM  Result Value Ref Range   Color, Urine STRAW (A) YELLOW   APPearance CLEAR CLEAR   Specific Gravity, Urine 1.011 1.005 - 1.030   pH 7.0 5.0 - 8.0   Glucose, UA  NEGATIVE NEGATIVE mg/dL   Hgb urine dipstick NEGATIVE NEGATIVE   Bilirubin Urine NEGATIVE NEGATIVE   Ketones, ur NEGATIVE NEGATIVE mg/dL   Protein, ur NEGATIVE NEGATIVE mg/dL   Nitrite NEGATIVE NEGATIVE   Leukocytes,Ua SMALL (A) NEGATIVE   RBC / HPF 0-5 0 - 5 RBC/hpf   WBC, UA 0-5 0 - 5 WBC/hpf   Bacteria, UA RARE (A) NONE SEEN   Squamous Epithelial / HPF 0-5 0 - 5 /HPF    Comment: Performed at [facility], [address], [place], [place] [address]     Review of Systems Physical Exam   Blood pressure 117/64, pulse 97, temperature 98.8 F (37.1 C), resp. rate 18, height 5\' 7"  (1.702 m), weight 75.7 kg, last menstrual period 11/25/2022, SpO2 100%, unknown if currently breastfeeding.  Physical Exam  MAU Course  Procedures  MDM  Pepcid 40 mg Zofran 8 mg ODT Maalox Patient reports improvement.  1 episode of vomiting in MAU, urine is reassuring.   Assessment and Plan  ***  Venia Carbon 03/09/2023, 11:58 PM

## 2023-03-09 NOTE — MAU Note (Addendum)
.  Tara Chapman is a 27 y.o. at [redacted]w[redacted]d reporting she is.unable to sleep since yesterday. I feel something is stuck from my stomach to my throat. I make myself cough to get rid of the feeling and it makes me throw up. I see blood in the emesis sometimes. I have only thrown up twice. The pain is burning. I try to eat to help the burning but hard to eat. She is not taking medication for heartburn.    Onset of complaint: today Pain score: 8 Vitals:   03/09/23 2052 03/09/23 2054  BP:  117/64  Pulse: 97   Resp: 18   Temp: 98.8 F (37.1 C)   SpO2: 100%      FHT:160 Lab orders placed from triage:  u/a

## 2023-03-09 NOTE — MAU Note (Addendum)
Pt states she has vomited twice since taking the pepcid. Unsure if she kept down the medication. Pt is sitting in Family Rm sipping water. Venia Carbon NP aware

## 2023-03-10 NOTE — Progress Notes (Signed)
WRitten and verbal d/c instructions given by Jonnie Kind RN and pt then d/c home

## 2023-03-21 LAB — OB RESULTS CONSOLE RUBELLA ANTIBODY, IGM: Rubella: IMMUNE

## 2023-03-21 LAB — OB RESULTS CONSOLE VARICELLA ZOSTER ANTIBODY, IGG: Varicella: IMMUNE

## 2023-03-21 LAB — HEPATITIS C ANTIBODY: HCV Ab: NEGATIVE

## 2023-04-08 ENCOUNTER — Inpatient Hospital Stay (HOSPITAL_COMMUNITY)
Admission: AD | Admit: 2023-04-08 | Discharge: 2023-04-08 | Disposition: A | Discharge status: Home / Self Care | Payer: Medicaid Other | Attending: Obstetrics and Gynecology | Admitting: Obstetrics and Gynecology

## 2023-04-08 ENCOUNTER — Encounter (HOSPITAL_COMMUNITY): Payer: Self-pay | Admitting: Obstetrics and Gynecology

## 2023-04-08 DIAGNOSIS — Z3A23 23 weeks gestation of pregnancy: Secondary | ICD-10-CM | POA: Diagnosis not present

## 2023-04-08 DIAGNOSIS — M5432 Sciatica, left side: Secondary | ICD-10-CM | POA: Diagnosis not present

## 2023-04-08 DIAGNOSIS — R197 Diarrhea, unspecified: Secondary | ICD-10-CM | POA: Diagnosis not present

## 2023-04-08 DIAGNOSIS — O26892 Other specified pregnancy related conditions, second trimester: Secondary | ICD-10-CM | POA: Insufficient documentation

## 2023-04-08 DIAGNOSIS — R1013 Epigastric pain: Secondary | ICD-10-CM | POA: Insufficient documentation

## 2023-04-08 LAB — URINALYSIS, ROUTINE W REFLEX MICROSCOPIC
Bilirubin Urine: NEGATIVE
Glucose, UA: NEGATIVE mg/dL
Ketones, ur: NEGATIVE mg/dL
Nitrite: NEGATIVE
Protein, ur: 30 mg/dL — AB
Specific Gravity, Urine: 1.024 (ref 1.005–1.030)
pH: 6 (ref 5.0–8.0)

## 2023-04-08 MED ORDER — CYCLOBENZAPRINE HCL 5 MG PO TABS: 5.0000 mg | ORAL_TABLET | Freq: Three times a day (TID) | ORAL | 0 refills | Status: AC | PRN

## 2023-04-08 MED ORDER — FAMOTIDINE IN NACL 20-0.9 MG/50ML-% IV SOLN
20.0000 mg | Freq: Once | INTRAVENOUS | Status: AC
  Administered 2023-04-08: 20 mg via INTRAVENOUS
  Filled 2023-04-08: qty 50

## 2023-04-08 MED ORDER — CYCLOBENZAPRINE HCL 5 MG PO TABS
5.0000 mg | ORAL_TABLET | Freq: Once | ORAL | Status: AC
  Administered 2023-04-08: 5 mg via ORAL
  Filled 2023-04-08: qty 1

## 2023-04-08 MED ORDER — LACTATED RINGERS IV BOLUS
1000.0000 mL | Freq: Once | INTRAVENOUS | Status: AC
  Administered 2023-04-08: 1000 mL via INTRAVENOUS

## 2023-04-08 NOTE — MAU Provider Note (Signed)
History     CSN: [id number]  Arrival date and time: 04/08/23 1806   Event Date/Time   First Provider Initiated Contact with Patient 04/08/23 1951      Chief Complaint  Patient presents with   Leg Pain   Diarrhea   HPI Tara Chapman is a 28 y.o. G2P1001 at [redacted]w[redacted]d presenting for diarrhea and back pain. She reports onset of diarrhea around 3am this morning. She denies any recent abx, new foods, travel. No sick contacts. She is unable to count how many episodes of diarrhea she has had. States it is watery, no blood in stool. Had one bowel movement throughout her visit to MAU today. She endorses some mild epigastric pain as well.   She also states she has been experiencing sharp, shooting pain originating in left buttocks that radiates down left leg. She reports pain worse with sitting for extended periods of time. She denies fevers, chills, saddle anesthesia, urinary/bowel incontinence.   Past Medical History:  Diagnosis Date   Medical history non-contributory     Past Surgical History:  Procedure Laterality Date   NO PAST SURGERIES      History reviewed. No pertinent family history.  Social History   Tobacco Use   Smoking status: Never   Smokeless tobacco: Never  Substance Use Topics   Alcohol use: No   Drug use: No    Allergies: No Known Allergies  Medications Prior to Admission  Medication Sig Dispense Refill Last Dose   famotidine (PEPCID) 20 MG tablet Take 1 tablet (20 mg total) by mouth 2 (two) times daily. 30 tablet 1 04/07/2023   Prenatal Vit-Fe Fumarate-FA (PREPLUS) 27-1 MG TABS Take 1 tablet by mouth daily. 30 tablet 13 04/07/2023    Review of Systems  Constitutional:  Negative for chills and fever.  Respiratory:  Negative for chest tightness, shortness of breath and wheezing.   Cardiovascular:  Negative for chest pain, palpitations and leg swelling.  Gastrointestinal:  Positive for diarrhea and nausea. Negative for abdominal pain,  constipation and vomiting.  Genitourinary:  Negative for dysuria, frequency, urgency, vaginal bleeding and vaginal pain.  Musculoskeletal:  Positive for back pain. Negative for myalgias.  Skin:  Negative for rash.  Neurological:  Negative for dizziness, light-headedness, numbness and headaches.   Physical Exam   Blood pressure 118/69, pulse 95, temperature 98 F (36.7 C), temperature source Oral, resp. rate 16, last menstrual period 11/25/2022, SpO2 100%, unknown if currently breastfeeding.  Physical Exam Constitutional:      General: She is not in acute distress.    Appearance: Normal appearance. She is not ill-appearing.  HENT:     Head: Normocephalic and atraumatic.  Cardiovascular:     Rate and Rhythm: Normal rate and regular rhythm.     Heart sounds: Normal heart sounds.  Pulmonary:     Effort: Pulmonary effort is normal. No respiratory distress.     Breath sounds: Normal breath sounds.  Abdominal:     General: Abdomen is flat.     Tenderness: There is no abdominal tenderness. There is no right CVA tenderness, left CVA tenderness or guarding.     Comments: gravid  Musculoskeletal:        General: Tenderness (point tenderness over left lumbar paraspinal muscles) present. Normal range of motion.     Comments: Positive left SLR  Skin:    General: Skin is warm and dry.     Findings: No rash.  Neurological:     General: No focal deficit  present.     Mental Status: She is alert and oriented to person, place, and time.  Psychiatric:        Mood and Affect: Mood normal.        Behavior: Behavior normal.   EFM: 150/mod/+a/-d  MAU Course  Procedures  MDM Kerrington Abulnasir Babiker Ericksen is a 27 y.o. G2P1001 at [redacted]w[redacted]d who presents for diarrhea and low back pain. Suspect diarrhea 2/2 infectious process vs food poisoning. Back pain hx/physical c/w sciatica, no red flag sxs. Will tx with IV fluids, Pepcid (for possible GERD), Flexeril.  9:09 PM Pt reports sxs improved. Will d/c  with Flexeril, encouraged hydration. Return precautions discussed. Stable for d/c.  Assessment and Plan  Sciatica of left side - Plan: Discharge patient  Diarrhea of presumed infectious origin - Plan: Discharge patient  Patient discussed with Dr. Donnetta Hutching 04/08/2023, 8:45 PM

## 2023-04-08 NOTE — MAU Note (Signed)
.  Tara Chapman is a 27 y.o. at [redacted]w[redacted]d here in MAU reporting: Diarrhea since 0300 this morning. Reports she is unable to count how many times. Denies nausea and vomiting. Reports left leg pain that has been ongoing "for a long time" but reports since yesterday the pain has been worse, especially with walking. She reports the pain shoots from the left lower side of her back to the bottom of her left calf. Denies VB or LOF. +FM.  Patient very tearful.  Pain score: 10/10 left leg  FHT: 160 initial external Lab orders placed from triage:  UA

## 2023-05-20 ENCOUNTER — Telehealth: Payer: Self-pay

## 2023-05-22 ENCOUNTER — Ambulatory Visit: Payer: Medicaid Other

## 2023-05-23 ENCOUNTER — Ambulatory Visit: Payer: Self-pay

## 2023-05-23 ENCOUNTER — Ambulatory Visit: Payer: Medicaid Other | Attending: Obstetrics and Gynecology

## 2023-05-23 DIAGNOSIS — O285 Abnormal chromosomal and genetic finding on antenatal screening of mother: Secondary | ICD-10-CM

## 2023-05-23 NOTE — Progress Notes (Signed)
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   DOB: 05/05/1996 Age: 27 y.o.   EDD: 07/30/2023 LMP: 11/25/2022 Referring Provider:  Melvenia Needles, NP  EGA: [redacted]w[redacted]d  Genetic Counselor: Sheppard Plumber, MS  OB Hx: [id number] Date of Appointment: 05/23/2023  Accompanied by: Tara Chapman (Arabic interpreter). Face to Face Time: 30 Minutes   Pregnancy History:   This is Tara Chapman's second pregnancy. She has one son. Denies personal history of high blood pressure, thyroid conditions, and seizures. Denies bleeding, infections, and fevers in this pregnancy. Denies using tobacco, alcohol, or street drugs in this pregnancy.   Family History: A three-generation pedigree was created and scanned into Epic under the Media tab.  Maternal ethnicity reported as [place] and paternal ethnicity reported as [place]. Denies Ashkenazi Jewish ancestry.  Family history not remarkable for consanguinity, individuals with birth defects, intellectual disability, autism spectrum disorder, multiple spontaneous abortions, still births, or unexplained neonatal death.     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 The laboratory identified the variant [id number] in Tara Chapman's screening, meaning there is increased risk for Tara Chapman's two copies of the SMN1 gene to be on the same chromosome. Specifically, given Tara Chapman's ethnic background, her risk to be a silent carrier is approximately 1 in 34 (~3%). Please see report for details.   We reviewed with Tara Chapman that if her two copies are on the  same chromosome that means her other chromosome would have zero copies, and there could be risk for an affected pregnancy if her reproductive partner is found to be a carrier for SMA.  Her husband, Tara Chapman, also had carrier screening for SMA. He was not found to be a carrier for SMA. This significantly reduces but does not eliminate the chance of being a carrier. Please see report for details.  We reviewed the mode of inheritance, function of the SMN1 gene, clinical features, and testing methodology of SMA. Tara Chapman was reassured that the chance of her baby being affected with SMA is very low.   Finally, we reviewed the other conditions screened for by carrier screening. For the other three conditions screened for, Tara Chapman was not found to be a carrier. Please see report for details. A negative result on carrier screening reduces but does not eliminate the chance of being a carrier.   Newborn Screening. The [place] Newborn Screening (NBS) program will screen all newborn babies for cystic fibrosis, spinal muscular atrophy, hemoglobinopathies, and numerous other conditions.   Previous Testing Completed:  Shary previously completed cell-free DNA screening (cfDNA) in this pregnancy. The result is low risk, consistent with a female fetus. This screening significantly reduces but does not eliminate the chance that the current pregnancy has Down syndrome (trisomy 68), trisomy 14, trisomy 76, common sex chromosome conditions, and 22q11.2 microdeletion syndrome. Please see report for details. Additionally, there are many genetic conditions that cannot be detected by cfDNA.    Patient Plan:  Proceed with: Routine prenatal care Informed consent was obtained. All questions were answered.   I spent 30 minutes in the care of the patient  today, including face-to-face time reviewing and discussing the genetic test results and available next steps.   Thank you for sharing in the care of Tara Chapman with [place].   Please do not hesitate to contact us at [phone] if you have any questions.  Sheppard Plumber, MS Genetic Counselor  Genetic counseling student involved in appointment: No..

## 2023-06-03 ENCOUNTER — Inpatient Hospital Stay (HOSPITAL_COMMUNITY)
Admission: AD | Admit: 2023-06-03 | Discharge: 2023-06-04 | Disposition: A | Discharge status: Home / Self Care | Payer: Medicaid Other | Attending: Obstetrics and Gynecology | Admitting: Obstetrics and Gynecology

## 2023-06-03 ENCOUNTER — Encounter (HOSPITAL_COMMUNITY): Payer: Self-pay | Admitting: Obstetrics and Gynecology

## 2023-06-03 DIAGNOSIS — O26893 Other specified pregnancy related conditions, third trimester: Secondary | ICD-10-CM | POA: Diagnosis present

## 2023-06-03 DIAGNOSIS — Z3A32 32 weeks gestation of pregnancy: Secondary | ICD-10-CM

## 2023-06-03 DIAGNOSIS — O368130 Decreased fetal movements, third trimester, not applicable or unspecified: Secondary | ICD-10-CM

## 2023-06-03 DIAGNOSIS — B3731 Acute candidiasis of vulva and vagina: Secondary | ICD-10-CM | POA: Diagnosis not present

## 2023-06-03 DIAGNOSIS — R051 Acute cough: Secondary | ICD-10-CM | POA: Diagnosis not present

## 2023-06-03 DIAGNOSIS — Z603 Acculturation difficulty: Secondary | ICD-10-CM

## 2023-06-03 DIAGNOSIS — J069 Acute upper respiratory infection, unspecified: Secondary | ICD-10-CM

## 2023-06-03 LAB — URINALYSIS, ROUTINE W REFLEX MICROSCOPIC
Bilirubin Urine: NEGATIVE
Glucose, UA: NEGATIVE mg/dL
Ketones, ur: NEGATIVE mg/dL
Nitrite: NEGATIVE
Protein, ur: NEGATIVE mg/dL
Specific Gravity, Urine: 1.019 (ref 1.005–1.030)
pH: 5 (ref 5.0–8.0)

## 2023-06-03 NOTE — MAU Note (Signed)
Pt says with interpreter- [id number] Says Saturday - she was working.  At home - had cough - feels abd pain when she coughs.  No fever Has runny nose- started yesterday  PNC- HD Feels baby moving. Has a constant upper abd  pain. Has been taking Tyl q6 hrs - last at 11am.

## 2023-06-04 DIAGNOSIS — R051 Acute cough: Secondary | ICD-10-CM | POA: Diagnosis not present

## 2023-06-04 DIAGNOSIS — Z3A32 32 weeks gestation of pregnancy: Secondary | ICD-10-CM

## 2023-06-04 DIAGNOSIS — O26893 Other specified pregnancy related conditions, third trimester: Secondary | ICD-10-CM | POA: Diagnosis not present

## 2023-06-04 LAB — GC/CHLAMYDIA PROBE AMP (~~LOC~~) NOT AT ARMC
Chlamydia: NEGATIVE
Comment: NEGATIVE
Comment: NORMAL
Neisseria Gonorrhea: NEGATIVE

## 2023-06-04 LAB — WET PREP, GENITAL
Clue Cells Wet Prep HPF POC: NONE SEEN
Sperm: NONE SEEN
Trich, Wet Prep: NONE SEEN
WBC, Wet Prep HPF POC: >=10 — AB (ref <10)

## 2023-06-04 LAB — CULTURE, OB URINE: Culture: NO GROWTH

## 2023-06-04 MED ORDER — BENZONATATE 100 MG PO CAPS
200.0000 mg | ORAL_CAPSULE | Freq: Once | ORAL | Status: AC
  Administered 2023-06-04: 200 mg via ORAL
  Filled 2023-06-04: qty 2

## 2023-06-04 MED ORDER — BENZONATATE 200 MG PO CAPS
200.0000 mg | ORAL_CAPSULE | Freq: Three times a day (TID) | ORAL | 0 refills | Status: DC | PRN
Start: 2023-06-04 — End: 2024-05-28

## 2023-06-04 MED ORDER — TERCONAZOLE 0.4 % VA CREA: 1.0000 | TOPICAL_CREAM | Freq: Every day | VAGINAL | 0 refills | Status: DC

## 2023-06-04 NOTE — MAU Provider Note (Incomplete)
CC:  Chief Complaint  Patient presents with  . Abdominal Pain     None     HPI: Tara Chapman is a 27 y.o. year old G61P1001 female at [redacted]w[redacted]d weeks gestation who presents to MAU reporting contractions every *** minutes since ***.  Pt says with interpreter- [id number] Says Saturday - she was working.  At home - had cough - feels abd pain when she coughs.  No fever Has runny nose- started yesterday  PNC- HD Feels baby moving. Has a constant upper abd  pain. Has been taking Tyl q6 hrs - last at 11am.  Associated Sx:  Vaginal bleeding: *** Leaking of fluid: *** Fetal movement: ***  O: Patient Vitals for the past 24 hrs:  BP Temp src Pulse Resp Weight  06/03/23 2329 116/67 Oral 94 14 77.1 kg    General: NAD Heart: Regular rate Lungs: Normal rate and effort Abd: Soft, NT, Gravid, S=D Pelvic: NEFG, ***pooling, *** blood.     EFM: ***, Moderate variability, 15 x 15 accelerations, no decelerations Toco: Contractions every *** minutes, ***  Orders Placed This Encounter  Procedures  . Culture, OB Urine  . Urinalysis, Routine w reflex microscopic -Urine, Clean Catch   No orders of the defined types were placed in this encounter.   A: [redacted]w[redacted]d week IUP *** labor/Braxton Hicks FHR reactive  P: Admit to L&D per consult w/ Constant, Peggy, MD/Discharge home in stable condition per consult with Constant, Peggy, MD. Labor/Preterm labor precautions and fetal kick counts. Follow-up as scheduled for prenatal visit or sooner as needed if symptoms worsen. Return to maternity admissions as needed if symptoms worsen.  Katrinka Blazing, [place], CNM 06/04/2023 12:00 AM  3

## 2023-06-04 NOTE — MAU Provider Note (Signed)
CC:  Chief Complaint  Patient presents with   Abdominal Pain      HPI: Tara Chapman is a 27 y.o. year old G73P1001 female at [redacted]w[redacted]d weeks gestation who presents to MAU lower and upper abdominal pain associated with cough since 06/01/2023.  Mild relief with Tylenol.  Also reports runny nose.  Gets prenatal care at [facility] department.  Reports she was recently prescribed medication for bladder infection but does not know the name.  Also reports decreased fetal movement today.  Associated Sx: Neg for fever, chills, N/V/D/C, urinary complaints, vaginal bleeding or vaginal discharge.   Arabic interpreter used.  O: Patient Vitals for the past 24 hrs:  BP Temp src Pulse Resp SpO2 Weight  06/04/23 0125 108/62 -- 98 15 98 % --  06/04/23 0033 113/63 -- 95 -- -- --  06/03/23 2329 116/67 Oral 94 14 -- 77.1 kg    General: NAD Head: Positive for nasal congestion and rhinorrhea.  Occasional cough. Heart: Regular rate Lungs: Normal rate and effort, CTAB Abd: Soft, NT, Gravid, S=D Pelvic: NEFG, no blood.  Moderate amount of white, odorless discharge Dilation: Closed Exam by:: Ivonne Andrew, CNM  EFM: 145, Moderate variability, 15 x 15 accelerations, no decelerations Toco: Uterine irritability  Labs Results for orders placed or performed during the hospital encounter of 06/03/23 (from the past 24 hour(s))  Urinalysis, Routine w reflex microscopic -Urine, Clean Catch     Status: Abnormal   Collection Time: 06/03/23 11:17 PM  Result Value Ref Range   Color, Urine YELLOW YELLOW   APPearance HAZY (A) CLEAR   Specific Gravity, Urine 1.019 1.005 - 1.030   pH 5.0 5.0 - 8.0   Glucose, UA NEGATIVE NEGATIVE mg/dL   Hgb urine dipstick SMALL (A) NEGATIVE   Bilirubin Urine NEGATIVE NEGATIVE   Ketones, ur NEGATIVE NEGATIVE mg/dL   Protein, ur NEGATIVE NEGATIVE mg/dL   Nitrite NEGATIVE NEGATIVE   Leukocytes,Ua MODERATE (A) NEGATIVE   RBC / HPF 0-5 0 - 5 RBC/hpf   WBC, UA  6-10 0 - 5 WBC/hpf   Bacteria, UA FEW (A) NONE SEEN   Squamous Epithelial / HPF 0-5 0 - 5 /HPF   Mucus PRESENT   Wet prep, genital     Status: Abnormal   Collection Time: 06/04/23 12:35 AM  Result Value Ref Range   Yeast Wet Prep HPF POC PRESENT (A) NONE SEEN   Trich, Wet Prep NONE SEEN NONE SEEN   Clue Cells Wet Prep HPF POC NONE SEEN NONE SEEN   WBC, Wet Prep HPF POC >=10 (A) <10   Sperm NONE SEEN      Orders Placed This Encounter  Procedures   Culture, OB Urine   Wet prep, genital   Urinalysis, Routine w reflex microscopic -Urine, Clean Catch   Discharge patient   Meds ordered this encounter  Medications   benzonatate (TESSALON) capsule 200 mg   benzonatate (TESSALON) 200 MG capsule    Sig: Take 1 capsule (200 mg total) by mouth 3 (three) times daily as needed for cough.    Dispense:  20 capsule    Refill:  0    Order Specific Question:   Supervising Provider    Answer:   CONSTANT, PEGGY [[id number]]   terconazole (TERAZOL 7) 0.4 % vaginal cream    Sig: Place 1 applicator vaginally at bedtime.    Dispense:  45 g    Refill:  0    Order Specific Question:   Supervising  Provider    Answer:   CONSTANT, PEGGY [[id number]]   MDM -Lower abdominal pain with no cervical dilation.  UA shows 6-10 WBCs, few bacteria but patient without urinary complaints.  Will send for culture and treat accordingly.  -Vaginal yeast infection.  Rx Terazol.  -Upper and lower abdominal pain that coincide with cough.  Abdomen nontender.  Negative Murphy sign.  Patient well-appearing.  Low suspicion for emergent etiology of pain.  Likely musculoskeletal pain.  Discussed comfort measures.  Rx Tessalon.  Increase fluids and rest.  -Decreased fetal movement resolved since arriving to MAU.  NST reactive.  Patient reassured.  A: [redacted]w[redacted]d week IUP 1. Acute cough   2. Viral upper respiratory tract infection   3. [redacted] weeks gestation of pregnancy   4. Decreased fetal movements in third trimester, single or unspecified  fetus   5. Abdominal pain during pregnancy in third trimester   6. Language barrier, cultural differences   7. Vaginal yeast infection   FHR reactive  P: Discharge home in stable condition Preterm labor precautions and fetal kick counts. Reviewed safe medications in pregnancy.  Increase fluids and rest. Follow-up as scheduled for prenatal visit or sooner as needed if symptoms worsen. Return to maternity admissions as needed if symptoms worsen.  Katrinka Blazing, [place], CNM 06/04/2023 6:01 AM  3

## 2023-06-28 ENCOUNTER — Encounter (HOSPITAL_COMMUNITY): Payer: Self-pay | Admitting: Obstetrics and Gynecology

## 2023-06-28 ENCOUNTER — Inpatient Hospital Stay (HOSPITAL_COMMUNITY)
Admission: AD | Admit: 2023-06-28 | Discharge: 2023-06-28 | Disposition: A | Discharge status: Home / Self Care | Payer: Medicaid Other | Attending: Obstetrics and Gynecology | Admitting: Obstetrics and Gynecology

## 2023-06-28 DIAGNOSIS — R109 Unspecified abdominal pain: Secondary | ICD-10-CM | POA: Diagnosis not present

## 2023-06-28 DIAGNOSIS — O26893 Other specified pregnancy related conditions, third trimester: Secondary | ICD-10-CM | POA: Insufficient documentation

## 2023-06-28 DIAGNOSIS — Z3A35 35 weeks gestation of pregnancy: Secondary | ICD-10-CM | POA: Insufficient documentation

## 2023-06-28 DIAGNOSIS — O98819 Other maternal infectious and parasitic diseases complicating pregnancy, unspecified trimester: Secondary | ICD-10-CM | POA: Diagnosis not present

## 2023-06-28 DIAGNOSIS — B3731 Acute candidiasis of vulva and vagina: Secondary | ICD-10-CM | POA: Diagnosis not present

## 2023-06-28 DIAGNOSIS — O4703 False labor before 37 completed weeks of gestation, third trimester: Secondary | ICD-10-CM | POA: Insufficient documentation

## 2023-06-28 DIAGNOSIS — Z3689 Encounter for other specified antenatal screening: Secondary | ICD-10-CM

## 2023-06-28 DIAGNOSIS — O479 False labor, unspecified: Secondary | ICD-10-CM

## 2023-06-28 LAB — URINALYSIS, ROUTINE W REFLEX MICROSCOPIC
Bilirubin Urine: NEGATIVE
Glucose, UA: NEGATIVE mg/dL
Ketones, ur: NEGATIVE mg/dL
Nitrite: NEGATIVE
Protein, ur: NEGATIVE mg/dL
Specific Gravity, Urine: 1.018 (ref 1.005–1.030)
pH: 5 (ref 5.0–8.0)

## 2023-06-28 LAB — WET PREP, GENITAL
Sperm: NONE SEEN
Trich, Wet Prep: NONE SEEN
WBC, Wet Prep HPF POC: >=10 — AB (ref <10)

## 2023-06-28 MED ORDER — TERCONAZOLE 0.4 % VA CREA: TOPICAL_CREAM | VAGINAL | 0 refills | Status: DC

## 2023-06-28 MED ORDER — ACETAMINOPHEN 500 MG PO TABS
1000.0000 mg | ORAL_TABLET | Freq: Once | ORAL | Status: AC
  Administered 2023-06-28: 1000 mg via ORAL
  Filled 2023-06-28: qty 2

## 2023-06-28 NOTE — MAU Note (Signed)
Pt says she has been having abd pain since yesterday 4/10 [facility]- HD- went last week

## 2023-06-28 NOTE — MAU Provider Note (Signed)
History     CSN: [id number]  Arrival date and time: 06/28/23 2117   Event Date/Time   First Provider Initiated Contact with Patient 06/28/23 2234      Chief Complaint  Patient presents with   Abdominal Pain   Tara Chapman is a 27 y.o. G2P1001 at [redacted]w[redacted]d who receives care at [facility].  She presents today for abdominal pain. Patient reports she has been experiencing a tightening sensation and cramping since yesterday.  She states the pain occurs at the top of her abdomen and radiates to her sides. She states the pain is improve with standing, but worse with laying down. She rates the pain a 6/10.  Patient endorses fetal movement and denies vaginal bleeding or discharge.  However, she reports wetness with walking, but states it has been present t/o the pregnancy.    OB History     Gravida  2   Para  1   Term  1   Preterm      AB      Living  1      SAB      IAB      Ectopic      Multiple  0   Live Births  1           Past Medical History:  Diagnosis Date   Medical history non-contributory     Past Surgical History:  Procedure Laterality Date   NO PAST SURGERIES      History reviewed. No pertinent family history.  Social History   Tobacco Use   Smoking status: Never   Smokeless tobacco: Never  Substance Use Topics   Alcohol use: No   Drug use: No    Allergies:  Allergies  Allergen Reactions   Beef-Derived Products Other (See Comments)    Religious preference   Pork-Derived Products Other (See Comments)    Religious preference    Medications Prior to Admission  Medication Sig Dispense Refill Last Dose   Prenatal Vit-Fe Fumarate-FA (PREPLUS) 27-1 MG TABS Take 1 tablet by mouth daily. 30 tablet 13 06/27/2023 at 2100   benzonatate (TESSALON) 200 MG capsule Take 1 capsule (200 mg total) by mouth 3 (three) times daily as needed for cough. 20 capsule 0    famotidine (PEPCID) 20 MG tablet Take 1 tablet (20 mg total) by mouth 2 (two)  times daily. 30 tablet 1    terconazole (TERAZOL 7) 0.4 % vaginal cream Place 1 applicator vaginally at bedtime. 45 g 0     Review of Systems  Constitutional:  Negative for chills and fever.  Gastrointestinal:  Positive for abdominal pain. Negative for constipation, diarrhea, nausea and vomiting.  Genitourinary:  Negative for difficulty urinating, dysuria, vaginal bleeding and vaginal discharge.  Neurological:  Negative for dizziness, light-headedness and headaches.   Physical Exam   Blood pressure 114/66, pulse 93, temperature 98.8 F (37.1 C), temperature source Oral, resp. rate 16, weight 86 kg, last menstrual period 11/25/2022, unknown if currently breastfeeding.  Physical Exam Vitals and nursing note reviewed. Exam conducted with a chaperone present Loletha Carrow, RN).  Constitutional:      Appearance: She is well-developed.  HENT:     Head: Normocephalic and atraumatic.  Eyes:     Conjunctiva/sclera: Conjunctivae normal.  Cardiovascular:     Rate and Rhythm: Normal rate.  Pulmonary:     Effort: Pulmonary effort is normal. No respiratory distress.  Abdominal:     Palpations: Abdomen is soft.  Tenderness: There is no abdominal tenderness.     Comments: Gravid, Appears AGA  Genitourinary:    General: Normal vulva.     Comments: Wet Prep Collected blindly Dilation: Closed Exam by:: Sabas Sous, CNM  Musculoskeletal:        General: Normal range of motion.     Cervical back: Normal range of motion.  Skin:    General: Skin is warm and dry.  Neurological:     Mental Status: She is alert and oriented to person, place, and time.  Psychiatric:        Mood and Affect: Mood normal.        Behavior: Behavior normal.     Fetal Assessment 150 bpm, Mod Var, -Decels, +Accels Toco: Irritability  MAU Course   Results for orders placed or performed during the hospital encounter of 06/28/23 (from the past 24 hour(s))  Urinalysis, Routine w reflex microscopic -Urine, Clean Catch      Status: Abnormal   Collection Time: 06/28/23 10:59 PM  Result Value Ref Range   Color, Urine YELLOW YELLOW   APPearance HAZY (A) CLEAR   Specific Gravity, Urine 1.018 1.005 - 1.030   pH 5.0 5.0 - 8.0   Glucose, UA NEGATIVE NEGATIVE mg/dL   Hgb urine dipstick SMALL (A) NEGATIVE   Bilirubin Urine NEGATIVE NEGATIVE   Ketones, ur NEGATIVE NEGATIVE mg/dL   Protein, ur NEGATIVE NEGATIVE mg/dL   Nitrite NEGATIVE NEGATIVE   Leukocytes,Ua MODERATE (A) NEGATIVE   RBC / HPF 6-10 0 - 5 RBC/hpf   WBC, UA 6-10 0 - 5 WBC/hpf   Bacteria, UA RARE (A) NONE SEEN   Squamous Epithelial / HPF 0-5 0 - 5 /HPF   Mucus PRESENT   Wet prep, genital     Status: Abnormal   Collection Time: 06/28/23 11:04 PM   Specimen: Vaginal  Result Value Ref Range   Yeast Wet Prep HPF POC PRESENT (A) NONE SEEN   Trich, Wet Prep NONE SEEN NONE SEEN   Clue Cells Wet Prep HPF POC PRESENT (A) NONE SEEN   WBC, Wet Prep HPF POC >=10 (A) <10   Sperm NONE SEEN    No results found.  MDM PE Labs: UA, Wet Prep EFM Assessment and Plan  27 year old G2P1001  SIUP at 35.3 weeks Cat I FT Vaginal Discharge Contractions  -Informed that abdominal pain is actually ctx. -Reassured that increased vaginal discharge is not uncommon during 3rd trimester,but will test for yeast and BV.  -Exam performed and findings discussed. -Wet prep Collected. -Patient requests pain medication. -Will give tylenol.  -NST reactive. -Await results and reassess.   Cherre Robins MSN, CNM 06/28/2023, 10:34 PM   Reassessment (11:45 PM) -Results as above. -NST remains reactive. -Provider to bedside to discuss results. -Patient reports she did not use Terazol cream continuously due to vaginal burning. -Reassured that this can be normal side effect with initial usage. -Instructed to use medication for 7 days. -Keep next appt as scheduled.  -Encouraged to call primary office or return to MAU if symptoms worsen or with the onset of new  symptoms. -Discharged to home in stable condition.  Cherre Robins MSN, CNM Advanced Practice Provider, [facility] [facility]

## 2023-07-19 ENCOUNTER — Inpatient Hospital Stay (HOSPITAL_COMMUNITY)
Admission: AD | Admit: 2023-07-19 | Discharge: 2023-07-19 | Disposition: A | Discharge status: Home / Self Care | Payer: Medicaid Other | Attending: Obstetrics and Gynecology | Admitting: Obstetrics and Gynecology

## 2023-07-19 ENCOUNTER — Encounter (HOSPITAL_COMMUNITY): Payer: Self-pay | Admitting: Obstetrics and Gynecology

## 2023-07-19 DIAGNOSIS — Z3A38 38 weeks gestation of pregnancy: Secondary | ICD-10-CM | POA: Insufficient documentation

## 2023-07-19 DIAGNOSIS — O471 False labor at or after 37 completed weeks of gestation: Secondary | ICD-10-CM | POA: Insufficient documentation

## 2023-07-19 NOTE — MAU Provider Note (Signed)
Tara Chapman is a 27 y.o. G2P1001 female at [redacted]w[redacted]d  RN Labor check, not seen by provider SVE by RN: Dilation: 2.5 Effacement (%): 50 Station: -3 Exam by:: Georgina Snell, RN NST: FHR baseline 150s bpm, Variability: moderate, Accelerations:present, Decelerations:  Absent= Cat 1/Reactive Toco: irregular, every 8-10 minutes  D/C home w labor precautions Keep next OB appt  Tara Chapman 07/19/2023 8:19 PM

## 2023-07-19 NOTE — MAU Note (Signed)
Tara Chapman Tara Chapman is a 27 y.o. at [redacted]w[redacted]d here in MAU reporting: pains in abd, come and go the past 6 hours, getting stronger and closer.  No bleeding or LOF.    Onset of complaint: 1200 Pain score: mod/severe Vitals:   07/19/23 1835  BP: 125/78  Pulse: (!) 104  Resp: 18  Temp: 98.5 F (36.9 C)  SpO2: 100%     WGN:FAOZHY to check due to clothing, reports +FM Lab orders placed from triage:

## 2023-07-25 ENCOUNTER — Inpatient Hospital Stay (HOSPITAL_COMMUNITY)
Admission: AD | Admit: 2023-07-25 | Discharge: 2023-07-28 | DRG: 806 | Disposition: A | Discharge status: Home / Self Care | Payer: Medicaid Other | Attending: Obstetrics & Gynecology | Admitting: Obstetrics & Gynecology

## 2023-07-25 ENCOUNTER — Encounter (HOSPITAL_COMMUNITY): Payer: Self-pay | Admitting: Obstetrics & Gynecology

## 2023-07-25 ENCOUNTER — Other Ambulatory Visit: Payer: Self-pay

## 2023-07-25 DIAGNOSIS — O99824 Streptococcus B carrier state complicating childbirth: Secondary | ICD-10-CM | POA: Diagnosis present

## 2023-07-25 DIAGNOSIS — O9902 Anemia complicating childbirth: Secondary | ICD-10-CM | POA: Diagnosis present

## 2023-07-25 DIAGNOSIS — O9832 Other infections with a predominantly sexual mode of transmission complicating childbirth: Secondary | ICD-10-CM | POA: Diagnosis present

## 2023-07-25 DIAGNOSIS — O368130 Decreased fetal movements, third trimester, not applicable or unspecified: Principal | ICD-10-CM | POA: Diagnosis present

## 2023-07-25 DIAGNOSIS — Z603 Acculturation difficulty: Secondary | ICD-10-CM

## 2023-07-25 DIAGNOSIS — O99820 Streptococcus B carrier state complicating pregnancy: Secondary | ICD-10-CM | POA: Diagnosis not present

## 2023-07-25 DIAGNOSIS — O368190 Decreased fetal movements, unspecified trimester, not applicable or unspecified: Secondary | ICD-10-CM | POA: Diagnosis present

## 2023-07-25 DIAGNOSIS — A5901 Trichomonal vulvovaginitis: Secondary | ICD-10-CM | POA: Diagnosis present

## 2023-07-25 DIAGNOSIS — Z3A39 39 weeks gestation of pregnancy: Secondary | ICD-10-CM

## 2023-07-25 LAB — CBC
HCT: 33.8 % — ABNORMAL LOW (ref 36.0–46.0)
Hemoglobin: 11.1 g/dL — ABNORMAL LOW (ref 12.0–15.0)
MCH: 30.6 pg (ref 26.0–34.0)
MCHC: 32.8 g/dL (ref 30.0–36.0)
MCV: 93.1 fL (ref 80.0–100.0)
Platelets: 416 10*3/uL — ABNORMAL HIGH (ref 150–400)
RBC: 3.63 MIL/uL — ABNORMAL LOW (ref 3.87–5.11)
RDW: 14.5 % (ref 11.5–15.5)
WBC: 9.9 10*3/uL (ref 4.0–10.5)
nRBC: 0 % (ref 0.0–0.2)

## 2023-07-25 LAB — TYPE AND SCREEN
ABO/RH(D): B POS
Antibody Screen: NEGATIVE

## 2023-07-25 LAB — WET PREP, GENITAL
Clue Cells Wet Prep HPF POC: NONE SEEN
Sperm: NONE SEEN
Trich, Wet Prep: NONE SEEN
WBC, Wet Prep HPF POC: >=10 — AB (ref <10)
Yeast Wet Prep HPF POC: NONE SEEN

## 2023-07-25 MED ORDER — PENICILLIN G POT IN DEXTROSE 60000 UNIT/ML IV SOLN
3.0000 10*6.[IU] | INTRAVENOUS | Status: DC
  Administered 2023-07-26: 3 10*6.[IU] via INTRAVENOUS
  Filled 2023-07-25: qty 50

## 2023-07-25 MED ORDER — ONDANSETRON HCL 4 MG/2ML IJ SOLN: 4.0000 mg | Freq: Four times a day (QID) | INTRAMUSCULAR | Status: DC | PRN

## 2023-07-25 MED ORDER — LIDOCAINE HCL (PF) 1 % IJ SOLN: 30.0000 mL | INTRAMUSCULAR | Status: DC | PRN

## 2023-07-25 MED ORDER — OXYTOCIN-SODIUM CHLORIDE 30-0.9 UT/500ML-% IV SOLN
2.5000 [IU]/h | INTRAVENOUS | Status: DC
  Filled 2023-07-25: qty 500

## 2023-07-25 MED ORDER — HYDROXYZINE HCL 50 MG PO TABS
50.0000 mg | ORAL_TABLET | Freq: Four times a day (QID) | ORAL | Status: DC | PRN
Start: 2023-07-25 — End: 2023-07-26

## 2023-07-25 MED ORDER — FENTANYL CITRATE (PF) 100 MCG/2ML IJ SOLN
50.0000 ug | INTRAMUSCULAR | Status: DC | PRN
  Administered 2023-07-26: 100 ug via INTRAVENOUS
  Filled 2023-07-25: qty 2

## 2023-07-25 MED ORDER — LACTATED RINGERS IV SOLN: 500.0000 mL | INTRAVENOUS | Status: DC | PRN

## 2023-07-25 MED ORDER — OXYCODONE-ACETAMINOPHEN 5-325 MG PO TABS
2.0000 | ORAL_TABLET | ORAL | Status: DC | PRN
  Administered 2023-07-26: 2 via ORAL
  Filled 2023-07-25: qty 2

## 2023-07-25 MED ORDER — OXYCODONE-ACETAMINOPHEN 5-325 MG PO TABS
1.0000 | ORAL_TABLET | ORAL | Status: DC | PRN
Start: 2023-07-25 — End: 2023-07-26

## 2023-07-25 MED ORDER — ACETAMINOPHEN 325 MG PO TABS
650.0000 mg | ORAL_TABLET | ORAL | Status: DC | PRN
Start: 2023-07-25 — End: 2023-07-26
  Administered 2023-07-25: 650 mg via ORAL
  Filled 2023-07-25: qty 2

## 2023-07-25 MED ORDER — OXYTOCIN BOLUS FROM INFUSION
333.0000 mL | Freq: Once | INTRAVENOUS | Status: AC
  Administered 2023-07-26: 333 mL via INTRAVENOUS

## 2023-07-25 MED ORDER — LACTATED RINGERS IV SOLN: INTRAVENOUS | Status: DC

## 2023-07-25 MED ORDER — SODIUM CHLORIDE 0.9 % IV SOLN
5.0000 10*6.[IU] | Freq: Once | INTRAVENOUS | Status: AC
  Administered 2023-07-25: 5 10*6.[IU] via INTRAVENOUS
  Filled 2023-07-25: qty 5

## 2023-07-25 MED ORDER — SOD CITRATE-CITRIC ACID 500-334 MG/5ML PO SOLN: 30.0000 mL | ORAL | Status: DC | PRN

## 2023-07-25 NOTE — H&P (Addendum)
H&P  Tara Chapman is a 27 y.o. G2P1001 female at [redacted]w[redacted]d by second trimester [place] presenting for regular contractions and decreased fetal movement.   She notes contractions started around 0200 this morning, and she noticed decreased fetal movement around 0900 this morning prompting presentation.   Reports decreased  fetal movement, contractions: regular, every 10 minutes, vaginal bleeding: none, membranes: intact.  Initiated prenatal care at [facility] at [redacted]w[redacted]d.   Most recent u/s 03/25/2023 Posterior placenta AFI 11 EFW >(7% .    Prenatal History/Complications:  Denies   Past Medical History: Past Medical History:  Diagnosis Date   Medical history non-contributory     Past Surgical History: Past Surgical History:  Procedure Laterality Date   NO PAST SURGERIES      Obstetrical History: OB History     Gravida  2   Para  1   Term  1   Preterm      AB      Living  1      SAB      IAB      Ectopic      Multiple  0   Live Births  1           Social History: Social History   Socioeconomic History   Marital status: Married    Spouse name: Not on file   Number of children: Not on file   Years of education: Not on file   Highest education level: Not on file  Occupational History   Not on file  Tobacco Use   Smoking status: Never   Smokeless tobacco: Never  Substance and Sexual Activity   Alcohol use: No   Drug use: No   Sexual activity: Yes    Comment: last ic 2 weeks ago  Other Topics Concern   Not on file  Social History Narrative   Not on file   Social Determinants of Health   Financial Resource Strain: Not on file  Food Insecurity: Not on file  Transportation Needs: Not on file  Physical Activity: Not on file  Stress: Not on file (06/27/2023)  Social Connections: Unknown (01/02/2022)   Received from [facility]   Social Network    Social Network: Not on file    Family History: No family history on  file.  Allergies: Allergies  Allergen Reactions   Beef-Derived Drug Products Other (See Comments)    Religious preference   Pork-Derived Products Other (See Comments)    Religious preference    Medications Prior to Admission  Medication Sig Dispense Refill Last Dose   amoxicillin (AMOXIL) 500 MG capsule Take 500 mg by mouth 3 (three) times daily.   07/24/2023   famotidine (PEPCID) 20 MG tablet Take 1 tablet (20 mg total) by mouth 2 (two) times daily. 30 tablet 1 Past Week   benzonatate (TESSALON) 200 MG capsule Take 1 capsule (200 mg total) by mouth 3 (three) times daily as needed for cough. 20 capsule 0    Prenatal Vit-Fe Fumarate-FA (PREPLUS) 27-1 MG TABS Take 1 tablet by mouth daily. 30 tablet 13 07/22/2023   terconazole (TERAZOL 7) 0.4 % vaginal cream Place one application, in vagina, every night for 7 days. 45 g 0     Review of Systems  Pertinent pos/neg as indicated in HPI  Blood pressure 124/80, pulse 96, temperature 98.2 F (36.8 C), temperature source Oral, resp. rate 17, height 5\' 5"  (1.651 m), weight 85.5 kg, last menstrual period 11/25/2022, SpO2  99%, unknown if currently breastfeeding. General appearance: alert and no distress Lungs: comfortable work of breathing, speaks in full sentences, no audible wheeze  Heart: regular rate and rhythm appears well perfused  Abdomen: gravid, soft, non-tender,    Fetal monitoring: FHR: 150 bpm, variability: moderate,  Accelerations: Present,  decelerations:  Absent Uterine activity: Frequency: Every 10 minutes Dilation: 3.5 Effacement (%): 80 Station: -2, -3 Exam by:: Warren-HIll,CNM Presentation: cephalic   Prenatal labs: ABO, Rh:  B+ Antibody:   negative antibody screen  Rubella:  Immune RPR:   Non reactive  HBsAg:   negative  HIV:   Nonreactive  GBS:   Positive; no allergies to PCN  1hr GTT 139; 3hr GTT: Fasting 105 - 118 / 102 / 89  AFP negative  G/C negative TB Quant - Negative  Horizon - increased risk for SMA  FOB negative   Prenatal Transfer Tool  Maternal Diabetes: No Genetic Screening: Abnormal:  Results: Other: Maternal Ultrasounds/Referrals: Normal Fetal Ultrasounds or other Referrals:  None Maternal Substance Abuse:  No Significant Maternal Medications:  None Significant Maternal Lab Results: Group B Strep positive  Results for orders placed or performed during the hospital encounter of 07/25/23 (from the past 24 hour(s))  Wet prep, genital   Collection Time: 07/25/23 11:49 AM   Specimen: Vaginal  Result Value Ref Range   Yeast Wet Prep HPF POC NONE SEEN NONE SEEN   Trich, Wet Prep NONE SEEN NONE SEEN   Clue Cells Wet Prep HPF POC NONE SEEN NONE SEEN   WBC, Wet Prep HPF POC >=10 (A) <10   Sperm NONE SEEN      Assessment:  [redacted]w[redacted]d SIUP  G2P1001  Latent Labor  Cat 1 FHR  GBS  Positive Plan PCN   Plan:  Admit to L&D. Expectant management while PCN going  IV pain meds/epidural prn active labor  Arabic / [place] Speaking - use interpreter   Anticipate SVD   Plans to Breast feed  Contraception: POPs   Circumcision: n/a - Girl   Nelta Numbers  Visiting Resident PGY3 - Family Medicine  07/25/2023, 4:00 PM  Attestation of Supervision of Student:  I confirm that I have verified the information documented in the resident's note and that I have also personally reperformed the history, physical exam and all medical decision making activities.  I have verified that all services and findings are accurately documented in this student's note; and I agree with management and plan as outlined in the documentation. I have also made any necessary editorial changes.  Joanne Gavel, MD OB Fellow 07/25/2023 5:42 PM

## 2023-07-25 NOTE — MAU Note (Signed)
Tara Chapman is a 27 y.o. at [redacted]w[redacted]d here in MAU reporting: she's been having ctxs that are 5 minutes apart, states has been having ctxs for 6 hours.  Denies VB or LOF.  Endorses +FM, less than usual. LMP: NA Onset of complaint: today Pain score: 5 Vitals:   07/25/23 1100  BP: 120/72  Pulse: 97  Resp: 17  Temp: 98.2 F (36.8 C)  SpO2: 98%     FHT:158 bpm Lab orders placed from triage:   None

## 2023-07-25 NOTE — MAU Note (Signed)
Iv attempt x 2 without success

## 2023-07-25 NOTE — Progress Notes (Signed)
Patient Vitals for the past 4 hrs:  BP Temp Temp src Pulse Resp  07/25/23 2103 (!) 103/49 -- -- 85 --  07/25/23 1933 126/62 -- -- 92 --  07/25/23 1925 -- (!) 97.4 F (36.3 C) Axillary -- 18   FHR Cat 1 all day.  Will allow intermittent EFM w/15 minutes q hours.  Pt declines augmentation at this time.  Wants to walk around.  Considering using breast pump if walking doesn't help. No change in cx.  Ctx mild and irregular.

## 2023-07-25 NOTE — MAU Provider Note (Addendum)
Attestation of Supervision of Student:  I confirm that I have verified the information documented in the medical resident's note and that I have also personally supervised the history, physical exam and all medical decision making activities.  I have verified that all services and findings are accurately documented in this student's note; and I agree with management and plan as outlined in the documentation. I have also made any necessary editorial changes.  Richardson Landry, CNM [facility] [employer], [facility] 07/25/2023 3:54 PM    History     CSN: [id number]  Arrival date and time: 07/25/23 1038   None     No chief complaint on file.  Patient reports onset of contractions around 2am, about 5 minutes apart. She states the pain is all around her abdomen and lower back. States the pain is tolerable, she was able to drive herself to the hospital without issue. She also states that she noticed decreased fetal movement early this morning. Most recently felt fetal movement in the MAU lobby. Also reports yellow/white vaginal discharge that has been heavier than usual over the past few days. Also reports vaginal itching for a few days, has used a cream for this. Denies vaginal bleeding or loss of clear fluid.     OB History     Gravida  2   Para  1   Term  1   Preterm      AB      Living  1      SAB      IAB      Ectopic      Multiple  0   Live Births  1           Past Medical History:  Diagnosis Date   Medical history non-contributory     Past Surgical History:  Procedure Laterality Date   NO PAST SURGERIES      No family history on file.  Social History   Tobacco Use   Smoking status: Never   Smokeless tobacco: Never  Substance Use Topics   Alcohol use: No   Drug use: No    Allergies:  Allergies  Allergen Reactions   Beef-Derived Drug Products Other (See Comments)    Religious preference   Pork-Derived Products Other  (See Comments)    Religious preference    Medications Prior to Admission  Medication Sig Dispense Refill Last Dose   amoxicillin (AMOXIL) 500 MG capsule Take 500 mg by mouth 3 (three) times daily.   07/24/2023   famotidine (PEPCID) 20 MG tablet Take 1 tablet (20 mg total) by mouth 2 (two) times daily. 30 tablet 1 Past Week   benzonatate (TESSALON) 200 MG capsule Take 1 capsule (200 mg total) by mouth 3 (three) times daily as needed for cough. 20 capsule 0    Prenatal Vit-Fe Fumarate-FA (PREPLUS) 27-1 MG TABS Take 1 tablet by mouth daily. 30 tablet 13 07/22/2023   terconazole (TERAZOL 7) 0.4 % vaginal cream Place one application, in vagina, every night for 7 days. 45 g 0     Review of Systems  Respiratory:  Negative for shortness of breath.   Cardiovascular:  Negative for chest pain and leg swelling.  Gastrointestinal:  Negative for abdominal pain.   Physical Exam   Blood pressure 124/80, pulse 96, temperature 98.2 F (36.8 C), temperature source Oral, resp. rate 17, height 5\' 5"  (1.651 m), weight 85.5 kg, last menstrual period 11/25/2022, SpO2 99%, unknown if currently breastfeeding.  Physical Exam Constitutional:      Appearance: Normal appearance.  HENT:     Head: Normocephalic and atraumatic.     Mouth/Throat:     Mouth: Mucous membranes are moist.  Pulmonary:     Effort: Pulmonary effort is normal.  Abdominal:     Palpations: Abdomen is soft.  Skin:    General: Skin is warm and dry.  Neurological:     Mental Status: She is alert.     Fetal Assessment 150 bpm, Mod Var, -Decels, +Accels Toco: irregular every 4-5 minutes  MAU Course   Results for orders placed or performed during the hospital encounter of 07/25/23 (from the past 24 hour(s))  Wet prep, genital     Status: Abnormal   Collection Time: 07/25/23 11:49 AM   Specimen: Vaginal  Result Value Ref Range   Yeast Wet Prep HPF POC NONE SEEN NONE SEEN   Trich, Wet Prep NONE SEEN NONE SEEN   Clue Cells Wet Prep  HPF POC NONE SEEN NONE SEEN   WBC, Wet Prep HPF POC >=10 (A) <10   Sperm NONE SEEN    No results found.  MDM PE Labs: wet prep, Gc/chlamydia EFM  Assessment and Plan  27 year old G2P1001  SIUP at [redacted]w[redacted]d Cat 1 FT  Cervical exams: 1/50/-3 on arrival, 2-3/80/-3 at 1345, 3.5/80/-3 at 1530  Patient in latent labor with report of decreased fetal movement. Per MFM, recommends admission for DFM at 39 weeks or greater. Discussed with L&D provider, Dr Earlene Plater, who will assume care of this patient on L&D unit.   Admit to L&D  Lorayne Bender PGY-1, Family Medicine 07/25/2023, 3:51 PM

## 2023-07-26 ENCOUNTER — Inpatient Hospital Stay (HOSPITAL_COMMUNITY): Payer: Medicaid Other | Admitting: Anesthesiology

## 2023-07-26 ENCOUNTER — Encounter (HOSPITAL_COMMUNITY): Payer: Self-pay | Admitting: Obstetrics and Gynecology

## 2023-07-26 DIAGNOSIS — O9832 Other infections with a predominantly sexual mode of transmission complicating childbirth: Secondary | ICD-10-CM

## 2023-07-26 DIAGNOSIS — O368190 Decreased fetal movements, unspecified trimester, not applicable or unspecified: Secondary | ICD-10-CM | POA: Diagnosis present

## 2023-07-26 DIAGNOSIS — Z3A39 39 weeks gestation of pregnancy: Secondary | ICD-10-CM

## 2023-07-26 DIAGNOSIS — O368130 Decreased fetal movements, third trimester, not applicable or unspecified: Secondary | ICD-10-CM

## 2023-07-26 DIAGNOSIS — A5901 Trichomonal vulvovaginitis: Secondary | ICD-10-CM | POA: Insufficient documentation

## 2023-07-26 DIAGNOSIS — O99820 Streptococcus B carrier state complicating pregnancy: Secondary | ICD-10-CM

## 2023-07-26 LAB — GC/CHLAMYDIA PROBE AMP (~~LOC~~) NOT AT ARMC
Chlamydia: NEGATIVE
Comment: NEGATIVE
Comment: NORMAL
Neisseria Gonorrhea: NEGATIVE

## 2023-07-26 LAB — RPR: RPR Ser Ql: NONREACTIVE

## 2023-07-26 MED ORDER — FENTANYL-BUPIVACAINE-NACL 0.5-0.125-0.9 MG/250ML-% EP SOLN
12.0000 mL/h | EPIDURAL | Status: DC | PRN
  Administered 2023-07-26: 12 mL/h via EPIDURAL
  Filled 2023-07-26: qty 250

## 2023-07-26 MED ORDER — METHYLERGONOVINE MALEATE 0.2 MG/ML IJ SOLN
INTRAMUSCULAR | Status: AC
  Administered 2023-07-26: 0.2 mg
  Filled 2023-07-26: qty 1

## 2023-07-26 MED ORDER — DIPHENHYDRAMINE HCL 25 MG PO CAPS: 25.0000 mg | ORAL_CAPSULE | Freq: Four times a day (QID) | ORAL | Status: DC | PRN

## 2023-07-26 MED ORDER — EPHEDRINE 5 MG/ML INJ
10.0000 mg | INTRAVENOUS | Status: DC | PRN
Start: 2023-07-26 — End: 2023-07-26

## 2023-07-26 MED ORDER — IBUPROFEN 600 MG PO TABS
600.0000 mg | ORAL_TABLET | Freq: Four times a day (QID) | ORAL | Status: DC
  Administered 2023-07-26 – 2023-07-28 (×9): 600 mg via ORAL
  Filled 2023-07-26 (×9): qty 1

## 2023-07-26 MED ORDER — BISACODYL 10 MG RE SUPP: 10.0000 mg | Freq: Every day | RECTAL | Status: DC | PRN

## 2023-07-26 MED ORDER — PRENATAL MULTIVITAMIN CH
1.0000 | ORAL_TABLET | Freq: Every day | ORAL | Status: DC
  Administered 2023-07-26 – 2023-07-28 (×3): 1 via ORAL
  Filled 2023-07-26 (×3): qty 1

## 2023-07-26 MED ORDER — METHYLERGONOVINE MALEATE 0.2 MG PO TABS: 0.2000 mg | ORAL_TABLET | ORAL | Status: DC | PRN

## 2023-07-26 MED ORDER — SENNOSIDES-DOCUSATE SODIUM 8.6-50 MG PO TABS
2.0000 | ORAL_TABLET | ORAL | Status: DC
  Administered 2023-07-26 – 2023-07-28 (×3): 2 via ORAL
  Filled 2023-07-26 (×3): qty 2

## 2023-07-26 MED ORDER — TRANEXAMIC ACID-NACL 1000-0.7 MG/100ML-% IV SOLN
1000.0000 mg | INTRAVENOUS | Status: AC
  Administered 2023-07-26: 1000 mg via INTRAVENOUS

## 2023-07-26 MED ORDER — SIMETHICONE 80 MG PO CHEW
80.0000 mg | CHEWABLE_TABLET | ORAL | Status: DC | PRN
Start: 2023-07-26 — End: 2023-07-28

## 2023-07-26 MED ORDER — DIPHENHYDRAMINE HCL 50 MG/ML IJ SOLN: 12.5000 mg | INTRAMUSCULAR | Status: DC | PRN

## 2023-07-26 MED ORDER — LACTATED RINGERS IV SOLN: 500.0000 mL | Freq: Once | INTRAVENOUS | Status: DC

## 2023-07-26 MED ORDER — METHYLERGONOVINE MALEATE 0.2 MG/ML IJ SOLN: 0.2000 mg | Freq: Once | INTRAMUSCULAR | Status: AC

## 2023-07-26 MED ORDER — METHYLERGONOVINE MALEATE 0.2 MG/ML IJ SOLN
0.2000 mg | INTRAMUSCULAR | Status: DC | PRN
Start: 2023-07-26 — End: 2023-07-28

## 2023-07-26 MED ORDER — FLEET ENEMA RE ENEM: 1.0000 | ENEMA | Freq: Every day | RECTAL | Status: DC | PRN

## 2023-07-26 MED ORDER — DIBUCAINE (PERIANAL) 1 % EX OINT: 1.0000 | TOPICAL_OINTMENT | CUTANEOUS | Status: DC | PRN

## 2023-07-26 MED ORDER — EPHEDRINE 5 MG/ML INJ: 10.0000 mg | INTRAVENOUS | Status: DC | PRN

## 2023-07-26 MED ORDER — WITCH HAZEL-GLYCERIN EX PADS: 1.0000 | MEDICATED_PAD | CUTANEOUS | Status: DC | PRN

## 2023-07-26 MED ORDER — ACETAMINOPHEN 325 MG PO TABS
650.0000 mg | ORAL_TABLET | ORAL | Status: DC | PRN
Start: 2023-07-26 — End: 2023-07-28
  Administered 2023-07-27 (×2): 650 mg via ORAL
  Filled 2023-07-26 (×2): qty 2

## 2023-07-26 MED ORDER — MEDROXYPROGESTERONE ACETATE 150 MG/ML IM SUSP: 150.0000 mg | INTRAMUSCULAR | Status: DC | PRN

## 2023-07-26 MED ORDER — BENZOCAINE-MENTHOL 20-0.5 % EX AERO: 1.0000 | INHALATION_SPRAY | CUTANEOUS | Status: DC | PRN

## 2023-07-26 MED ORDER — METRONIDAZOLE 500 MG PO TABS
2000.0000 mg | ORAL_TABLET | Freq: Once | ORAL | Status: AC
  Administered 2023-07-26: 2000 mg via ORAL
  Filled 2023-07-26: qty 4

## 2023-07-26 MED ORDER — PHENYLEPHRINE 80 MCG/ML (10ML) SYRINGE FOR IV PUSH (FOR BLOOD PRESSURE SUPPORT)
80.0000 ug | PREFILLED_SYRINGE | INTRAVENOUS | Status: DC | PRN
Start: 2023-07-26 — End: 2023-07-26

## 2023-07-26 MED ORDER — COCONUT OIL OIL
1.0000 | TOPICAL_OIL | Status: DC | PRN
  Administered 2023-07-26: 1 via TOPICAL

## 2023-07-26 MED ORDER — LIDOCAINE HCL (PF) 1 % IJ SOLN
INTRAMUSCULAR | Status: DC | PRN
  Administered 2023-07-26: 11 mL via EPIDURAL

## 2023-07-26 MED ORDER — ONDANSETRON HCL 4 MG/2ML IJ SOLN: 4.0000 mg | INTRAMUSCULAR | Status: DC | PRN

## 2023-07-26 MED ORDER — FERROUS SULFATE 325 (65 FE) MG PO TABS
325.0000 mg | ORAL_TABLET | ORAL | Status: DC
  Administered 2023-07-26 – 2023-07-28 (×2): 325 mg via ORAL
  Filled 2023-07-26 (×2): qty 1

## 2023-07-26 MED ORDER — PHENYLEPHRINE 80 MCG/ML (10ML) SYRINGE FOR IV PUSH (FOR BLOOD PRESSURE SUPPORT): 80.0000 ug | PREFILLED_SYRINGE | INTRAVENOUS | Status: DC | PRN

## 2023-07-26 MED ORDER — TETANUS-DIPHTH-ACELL PERTUSSIS 5-2.5-18.5 LF-MCG/0.5 IM SUSY: 0.5000 mL | PREFILLED_SYRINGE | Freq: Once | INTRAMUSCULAR | Status: DC

## 2023-07-26 MED ORDER — TRANEXAMIC ACID-NACL 1000-0.7 MG/100ML-% IV SOLN
INTRAVENOUS | Status: AC
  Filled 2023-07-26: qty 100

## 2023-07-26 MED ORDER — ONDANSETRON HCL 4 MG PO TABS: 4.0000 mg | ORAL_TABLET | ORAL | Status: DC | PRN

## 2023-07-26 NOTE — Progress Notes (Signed)
Patient Vitals for the past 4 hrs:  BP Temp Temp src Pulse  07/26/23 0010 127/70 -- -- 91  07/26/23 0009 -- 98.8 F (37.1 C) Oral --  07/25/23 2103 (!) 103/49 -- -- 85   Ctx are q 2-7 minutes.  FHR Cat 1.  Cx 4/70/-2.  AROM w/ clear fluid.  If not in labor in 6 hours, pt agrees to pitocin.

## 2023-07-26 NOTE — Lactation Note (Signed)
This note was copied from a baby's chart. Lactation Consultation Note  Patient Name: Tara Chapman [id number] Date: 07/26/2023 Age:29 hours , P2, experienced BF  Reason for consult: Initial assessment;Term;Breastfeeding assistance (Arabic Interepreter - [employer] - # [phone]) Its been 4 years since mom BF.  LC reviewed BF goals for 24 hours - to feed with cues and by 3 hours if the baby isn't awake to check the diaper, change if needed and offer the breast.  A bottle of formula at beside and per mom the baby didn't like it.  After small wet diaper change baby woke up and was rooting, LC offered to assist to latch and mom receptive.  LC placed baby STS on the right breast, and noted areola edema and very dry skin. LC mentioned to mom a dab of coconut oil will soften the areola. Per mom not allergic.  LC showed mom the reverse pressure exercise and the areola compressed well for depth at the breast.  Swallows noted and increased with breast compressions.  Per mom mentioned she was cramping and LC advised prior to next feeding empty her bladder and usually cramping goes away by 72 hours.  Per mom has ordered her DEBP through her Medicaid.   Maternal Data Has patient been taught Hand Expression?: Yes Does the patient have breastfeeding experience prior to this delivery?: Yes How long did the patient breastfeed?: per mom 2 years, now 27 years old  Feeding Mother's Current Feeding Choice: Breast Milk and Formula (per mom the baby doesn't care for the formula)  LATCH Score Latch: Grasps breast easily, tongue down, lips flanged, rhythmical sucking.  Audible Swallowing: Spontaneous and intermittent  Type of Nipple: Everted at rest and after stimulation  Comfort (Breast/Nipple): Soft / non-tender  Hold (Positioning): Assistance needed to correctly position infant at breast and maintain latch.  LATCH Score: 9   Lactation Tools Discussed/Used Pump Education: Milk  Storage  Interventions Interventions: Breast feeding basics reviewed;Assisted with latch;Skin to skin;Breast massage;Hand express;Reverse pressure;Breast compression;Adjust position;Support pillows;Position options;Education;CDC Guidelines for Breast Pump Cleaning;LC Services brochure  Discharge Pump: Personal;DEBP (Moms Medicaid is not covered by [employer]. per mom has ordered her pump with [employer]) WIC Program: Yes  Consult Status Consult Status: Follow-up Date: 07/27/23 Follow-up type: In-patient    Matilde Sprang Md Smola 07/26/2023, 9:07 AM

## 2023-07-26 NOTE — Anesthesia Procedure Notes (Signed)
Epidural Patient location during procedure: OB Start time: 07/26/2023 2:38 AM End time: 07/26/2023 2:56 AM  Staffing Anesthesiologist: Lowella Curb, MD Performed: anesthesiologist   Preanesthetic Checklist Completed: patient identified, IV checked, site marked, risks and benefits discussed, surgical consent, monitors and equipment checked, pre-op evaluation and timeout performed  Epidural Patient position: sitting Prep: ChloraPrep Patient monitoring: heart rate, cardiac monitor, continuous pulse ox and blood pressure Approach: midline Location: L2-L3 Injection technique: LOR saline  Needle:  Needle type: Tuohy  Needle gauge: 17 G Needle length: 9 cm Needle insertion depth: 6 cm Catheter type: closed end flexible Catheter size: 20 Guage Catheter at skin depth: 10 cm Test dose: negative  Assessment Events: blood not aspirated, injection not painful, no injection resistance, no paresthesia and negative IV test  Additional Notes Reason for block:procedure for pain

## 2023-07-26 NOTE — Anesthesia Preprocedure Evaluation (Signed)
Anesthesia Evaluation  Patient identified by MRN, date of birth, ID band Patient awake    Reviewed: Allergy & Precautions, H&P , NPO status , Patient's Chart, lab work & pertinent test results  Airway Mallampati: I  TM Distance: >3 FB Neck ROM: full    Dental no notable dental hx. (+) Teeth Intact   Pulmonary neg pulmonary ROS   Pulmonary exam normal breath sounds clear to auscultation       Cardiovascular negative cardio ROS Normal cardiovascular exam Rhythm:regular Rate:Normal     Neuro/Psych negative neurological ROS  negative psych ROS   GI/Hepatic negative GI ROS, Neg liver ROS,,,  Endo/Other  negative endocrine ROS    Renal/GU negative Renal ROS  negative genitourinary   Musculoskeletal negative musculoskeletal ROS (+)    Abdominal Normal abdominal exam  (+)   Peds  Hematology  (+) Blood dyscrasia, anemia   Anesthesia Other Findings   Reproductive/Obstetrics (+) Pregnancy                              Anesthesia Physical Anesthesia Plan  ASA: II  Anesthesia Plan: Epidural   Post-op Pain Management:    Induction:   PONV Risk Score and Plan:   Airway Management Planned:   Additional Equipment:   Intra-op Plan:   Post-operative Plan:   Informed Consent: I have reviewed the patients History and Physical, chart, labs and discussed the procedure including the risks, benefits and alternatives for the proposed anesthesia with the patient or authorized representative who has indicated his/her understanding and acceptance.       Plan Discussed with:   Anesthesia Plan Comments:          Anesthesia Quick Evaluation

## 2023-07-26 NOTE — Discharge Summary (Signed)
Postpartum Discharge Summary  Date of Service updated***     Patient Name: Tara Chapman DOB: 09-Jul-1996 MRN: [id number]  Date of admission: 07/25/2023 Delivery date:07/26/2023 Delivering provider: Jacklyn Shell Date of discharge: 07/26/2023  Admitting diagnosis: Labor and delivery indication for care or intervention [O75.9] Intrauterine pregnancy: [redacted]w[redacted]d     Secondary diagnosis:  Principal Problem:   Labor and delivery indication for care or intervention Active Problems:   Language barrier, cultural differences   Decreased fetal movement   Vaginal delivery  Additional problems: ***    Discharge diagnosis: Term Pregnancy Delivered                                              Post partum procedures:{Postpartum procedures:23558} Augmentation: AROM Complications: None  Hospital course: Onset of Labor With Vaginal Delivery      27 y.o. yo G2P1001 at [redacted]w[redacted]d was admitted in Latent Labor on 07/25/2023. Labor course was complicated by nothing  Membrane Rupture Time/Date: 12:34 AM,07/26/2023  Delivery Method:Vaginal, Spontaneous Operative Delivery:N/A Episiotomy: None Lacerations:    Patient had a postpartum course complicated by ***.  She is ambulating, tolerating a regular diet, passing flatus, and urinating well. Patient is discharged home in stable condition on 07/26/23.  Newborn Data: Birth date:07/26/2023 Birth time:3:23 AM Gender:Female Living status:Living Apgars:8 ,9  Weight:   Magnesium Sulfate received: No BMZ received: No Rhophylac:N/A MMR:N/A T-DaP:{Tdap:23962} Flu: {QMV:78469} RSV Vaccine received: No Transfusion:{Transfusion received:30440034}  Immunizations received: Immunization History  Administered Date(s) Administered   Influenza,inj,Quad PF,6+ Mos 05/15/2017   Tdap 05/01/2017    Physical exam  Vitals:   07/25/23 2103 07/26/23 0009 07/26/23 0010 07/26/23 0110  BP: (!) 103/49  127/70   Pulse: 85  91   Resp:       Temp:  98.8 F (37.1 C)  98 F (36.7 C)  TempSrc:  Oral  Oral  SpO2:      Weight:      Height:       General: {Exam; general:21111117} Lochia: {Desc; appropriate/inappropriate:30686::"appropriate"} Uterine Fundus: {Desc; firm/soft:30687} Incision: {Exam; incision:21111123} DVT Evaluation: {Exam; dvt:2111122} Labs: Lab Results  Component Value Date   WBC 9.9 07/25/2023   HGB 11.1 (L) 07/25/2023   HCT 33.8 (L) 07/25/2023   MCV 93.1 07/25/2023   PLT 416 (H) 07/25/2023      Latest Ref Rng & Units 12/22/2022   11:09 PM  CMP  Glucose 70 - 99 mg/dL 94   BUN 6 - 20 mg/dL 7   Creatinine 6.29 - 5.28 mg/dL 4.13   Sodium 244 - 010 mmol/L 133   Potassium 3.5 - 5.1 mmol/L 3.7   Chloride 98 - 111 mmol/L 100   CO2 22 - 32 mmol/L 24   Calcium 8.9 - 10.3 mg/dL 9.3   Total Protein 6.5 - 8.1 g/dL 7.8   Total Bilirubin 0.3 - 1.2 mg/dL 0.3   Alkaline Phos 38 - 126 U/L 41   AST 15 - 41 U/L 19   ALT 0 - 44 U/L 22    Edinburgh Score:    09/10/2017    3:50 PM  Edinburgh Postnatal Depression Scale Screening Tool  I have been able to laugh and see the funny side of things. 0  I have looked forward with enjoyment to things. 0  I have blamed myself unnecessarily when things went wrong. 0  I  have been anxious or worried for no good reason. 0  I have felt scared or panicky for no good reason. 0  Things have been getting on top of me. 0  I have been so unhappy that I have had difficulty sleeping. 0  I have felt sad or miserable. 0  The thought of harming myself has occurred to me. 0   No data recorded  After visit meds:  Allergies as of 07/26/2023       Reactions   Beef-derived Drug Products Other (See Comments)   Religious preference   Pork-derived Products Other (See Comments)   Religious preference     Med Rec must be completed prior to using this [facility]***           07/26/2023 Jacklyn Shell, CNM

## 2023-07-26 NOTE — Anesthesia Postprocedure Evaluation (Signed)
Anesthesia Post Note  Patient: Lawyer Gruen  Procedure(s) Performed: AN AD HOC LABOR EPIDURAL     Patient location during evaluation: Mother Baby Anesthesia Type: Epidural Level of consciousness: awake and alert Pain management: pain level controlled Vital Signs Assessment: post-procedure vital signs reviewed and stable Respiratory status: spontaneous breathing, nonlabored ventilation and respiratory function stable Cardiovascular status: stable Postop Assessment: no headache, no backache and epidural receding Anesthetic complications: no   No notable events documented.  Last Vitals:  Vitals:   07/26/23 1112 07/26/23 1448  BP: 114/60 116/66  Pulse: 86 91  Resp: 18 20  Temp: 37.1 C 37 C  SpO2: 100% 97%    Last Pain:  Vitals:   07/26/23 1451  TempSrc:   PainSc: 4    Pain Goal:                   Rashod Gougeon

## 2023-07-27 ENCOUNTER — Encounter (HOSPITAL_COMMUNITY): Payer: Self-pay | Admitting: Emergency Medicine

## 2023-07-27 MED ORDER — FUROSEMIDE 20 MG PO TABS
20.0000 mg | ORAL_TABLET | Freq: Every day | ORAL | Status: DC
  Administered 2023-07-27 – 2023-07-28 (×2): 20 mg via ORAL
  Filled 2023-07-27 (×2): qty 1

## 2023-07-27 NOTE — Lactation Note (Signed)
This note was copied from a baby's chart. Lactation Consultation Note  Patient Name: Tara Chapman [id number] Date: 07/27/2023 Age:29 hours Reason for consult: Follow-up assessment;Term  P2- MOB reports that infant is feeding well, but she has mostly offered bottles of formula until her milk comes in. MOB denies having any questions or concerns at this time. MOB requested to be sent home with a manual pump. LC provided MOB with a manual pump.  LC reviewed CDC milk storage guidelines, LC services handout and engorgement/breast care. LC encouraged MOB to call for further assistance as needed.  Maternal Data Does the patient have breastfeeding experience prior to this delivery?: Yes  Feeding Mother's Current Feeding Choice: Breast Milk and Formula Nipple Type: Slow - flow  Lactation Tools Discussed/Used Tools: Pump Breast pump type: Manual Pump Education: Milk Storage;Setup, frequency, and cleaning Reason for Pumping: MOB request Pumping frequency: as needed  Interventions Interventions: Breast feeding basics reviewed;Hand pump;Education;LC Services brochure  Discharge Discharge Education: Engorgement and breast care;Warning signs for feeding baby Pump: Manual;Personal  Consult Status Consult Status: Complete Date: 07/27/23    Dema Severin BS, IBCLC 07/27/2023, 4:45 PM

## 2023-07-27 NOTE — Progress Notes (Signed)
POSTPARTUM PROGRESS NOTE  Post Partum Day 1  Subjective:  Tara Chapman is a 27 y.o. [id number] s/p SVD at [redacted]w[redacted]d.  She reports she is doing well. No acute events overnight. She denies any problems with ambulating, voiding or po intake. Denies nausea or vomiting.  Pain is well controlled.  Lochia is appropriate.  Objective: Blood pressure (!) 104/51, pulse 72, temperature 99 F (37.2 C), temperature source Oral, resp. rate 18, height 5\' 5"  (1.651 m), weight 85.5 kg, last menstrual period 11/25/2022, SpO2 100%, unknown if currently breastfeeding.  Physical Exam:  General: alert, cooperative and no distress Chest: no respiratory distress Heart:regular rate, distal pulses intact Uterine Fundus: firm, appropriately tender DVT Evaluation: No calf swelling or tenderness Extremities: trace edema Skin: warm, dry  Recent Labs    07/25/23 1653  HGB 11.1*  HCT 33.8*    Assessment/Plan: Tara Chapman is a 27 y.o. Q4O9629 s/p SVD at [redacted]w[redacted]d   PPD#1 - Doing well  Routine postpartum care  Pt declined translator services  Contraception: POPs Feeding: Breast Dispo: Plan for discharge tomorrow.   LOS: 2 days   Wyn Forster, MD OB Fellow  07/27/2023, 10:27 AM

## 2023-07-28 MED ORDER — FUROSEMIDE 20 MG PO TABS: 20.0000 mg | ORAL_TABLET | Freq: Every day | ORAL | 0 refills | Status: DC

## 2023-07-28 MED ORDER — IBUPROFEN 600 MG PO TABS: 600.0000 mg | ORAL_TABLET | Freq: Four times a day (QID) | ORAL | 1 refills | Status: DC

## 2023-07-28 MED ORDER — SENNOSIDES-DOCUSATE SODIUM 8.6-50 MG PO TABS: 2.0000 | ORAL_TABLET | Freq: Two times a day (BID) | ORAL | 0 refills | Status: DC | PRN

## 2023-07-28 MED ORDER — FERROUS SULFATE 325 (65 FE) MG PO TABS: 325.0000 mg | ORAL_TABLET | ORAL | 3 refills | Status: AC

## 2023-08-05 ENCOUNTER — Telehealth (HOSPITAL_COMMUNITY): Payer: Self-pay | Admitting: *Deleted

## 2023-08-05 NOTE — Telephone Encounter (Signed)
08/05/2023  Name: Tara Chapman MRN: [id number] DOB: 06/03/1996  Reason for Call:  Transition of Care Hospital Discharge Call  Contact Status: Patient Contact Status: Complete  Language assistant needed: Interpreter Mode: Telephonic Interpreter Interpreter Name: Imam [phone]        Follow-Up Questions: Do You Have Any Concerns About Your Health As You Heal From Delivery?: No Do You Have Any Concerns About Your Infants Health?: No  Edinburgh Postnatal Depression Scale:  In the Past 7 Days: I have been able to laugh and see the funny side of things.: As much as I always could I have looked forward with enjoyment to things.: As much as I ever did I have blamed myself unnecessarily when things went wrong.: No, never I have been anxious or worried for no good reason.: No, not at all I have felt scared or panicky for no good reason.: No, not at all Things have been getting on top of me.: No, I have been coping as well as ever I have been so unhappy that I have had difficulty sleeping.: Not at all I have felt sad or miserable.: No, not at all I have been so unhappy that I have been crying.: No, never The thought of harming myself has occurred to me.: Never Edinburgh Postnatal Depression Scale Total: 0  PHQ2-9 Depression Scale:     Discharge Follow-up: Edinburgh score requires follow up?: No  Post-discharge interventions: Reviewed Newborn Safe Sleep Practices  Salena Saner, RN 08/05/2023 15:53

## 2024-05-28 ENCOUNTER — Ambulatory Visit (HOSPITAL_COMMUNITY)
Admission: EM | Admit: 2024-05-28 | Discharge: 2024-05-28 | Disposition: A | Discharge status: Home / Self Care | Attending: Student | Admitting: Student

## 2024-05-28 ENCOUNTER — Encounter (HOSPITAL_COMMUNITY): Payer: Self-pay

## 2024-05-28 DIAGNOSIS — Z3202 Encounter for pregnancy test, result negative: Secondary | ICD-10-CM | POA: Diagnosis not present

## 2024-05-28 DIAGNOSIS — R1030 Lower abdominal pain, unspecified: Secondary | ICD-10-CM | POA: Diagnosis present

## 2024-05-28 LAB — POCT URINALYSIS DIP (MANUAL ENTRY)
Bilirubin, UA: NEGATIVE
Glucose, UA: NEGATIVE mg/dL
Ketones, POC UA: NEGATIVE mg/dL
Leukocytes, UA: NEGATIVE
Nitrite, UA: NEGATIVE
Protein Ur, POC: NEGATIVE mg/dL
Spec Grav, UA: >=1.03 — AB
Urobilinogen, UA: 0.2 U/dL
pH, UA: 5.5

## 2024-05-28 LAB — POCT URINE PREGNANCY: Preg Test, Ur: NEGATIVE

## 2024-05-28 NOTE — ED Notes (Signed)
 Patient is being discharged from the Urgent Care and sent to the Emergency Department via personal opperated vehicle . Per Leita Molly PA, patient is in need of higher level of care due to Abdominal pain. Patient is aware and verbalizes understanding of plan of care.  Vitals:   05/28/24 1528  BP: 110/75  Pulse: 79  Resp: 18  Temp: 98.3 F (36.8 C)  SpO2: 97%

## 2024-05-28 NOTE — Discharge Instructions (Addendum)
-   Please go to the emergency department for your abdominal pain.  I am concerned that how severe the pain is.  It could indicate an issue like infection with your uterus, your gallbladder, your appendix, your intestines, your stomach.  You need imaging and treatment that I cannot perform at urgent care.

## 2024-05-28 NOTE — ED Provider Notes (Signed)
 [facility]    CSN: [id number] Arrival date & time: 05/28/24  1420      History   Chief Complaint Chief Complaint  Patient presents with   Back Pain    HPI Tara Chapman is a 28 y.o. female presenting with back pain (L > R) for 3 days.  History vaginal delivery, she gave birth 10 months ago.  Chart review indicates history of hernia, trichomonas.  The patient describes back pain, but upon discussion it seems more like abdominal pain.  She denies urinary symptoms, bowel symptoms.  She does endorse some external vaginal itching.  Has attempted IcyHot without much relief.  HPI  Past Medical History:  Diagnosis Date   Medical history non-contributory     Patient Active Problem List   Diagnosis Date Noted   Decreased fetal movement 07/26/2023   Vaginal delivery 07/26/2023   Trichomonas vaginalis (TV) infection 07/26/2023   Labor and delivery indication for care or intervention 07/25/2023   Language barrier, cultural differences 01/31/2017    Past Surgical History:  Procedure Laterality Date   NO PAST SURGERIES      OB History     Gravida  2   Para  2   Term  2   Preterm      AB      Living  2      SAB      IAB      Ectopic      Multiple  0   Live Births  2            Home Medications    Prior to Admission medications   Medication Sig Start Date End Date Taking? Authorizing Provider  Prenatal Vit-Fe Fumarate-FA (PREPLUS) 27-1 MG TABS Take 1 tablet by mouth daily. 12/22/22  Yes Gene Lukes C, CNM  ferrous sulfate  325 (65 FE) MG tablet Take 1 tablet (325 mg total) by mouth every other day. 07/30/23   Jhonny Augustin BROCKS, MD    Family History History reviewed. No pertinent family history.  Social History Social History   Tobacco Use   Smoking status: Never   Smokeless tobacco: Never  Substance Use Topics   Alcohol use: No   Drug use: No     Allergies   Bovine (beef) protein-containing drug products and  Porcine (pork) protein-containing drug products   Review of Systems Review of Systems  Gastrointestinal:  Positive for abdominal pain.     Physical Exam Triage Vital Signs ED Triage Vitals  Encounter Vitals Group     BP 05/28/24 1528 110/75     Girls Systolic BP Percentile --      Girls Diastolic BP Percentile --      Boys Systolic BP Percentile --      Boys Diastolic BP Percentile --      Pulse Rate 05/28/24 1528 79     Resp 05/28/24 1528 18     Temp 05/28/24 1528 98.3 F (36.8 C)     Temp Source 05/28/24 1528 Oral     SpO2 05/28/24 1528 97 %     Weight --      Height 05/28/24 1527 5' 5 (1.651 m)     Head Circumference --      Peak Flow --      Pain Score 05/28/24 1525 10     Pain Loc --      Pain Education --      Exclude from Growth Chart --  No data found.  Updated Vital Signs BP 110/75 (BP Location: Left Arm)   Pulse 79   Temp 98.3 F (36.8 C) (Oral)   Resp 18   Ht 5' 5 (1.651 m)   LMP  (LMP Unknown)   SpO2 97%   Breastfeeding Yes   BMI 31.37 kg/m   Visual Acuity Right Eye Distance:   Left Eye Distance:   Bilateral Distance:    Right Eye Near:   Left Eye Near:    Bilateral Near:     Physical Exam Vitals reviewed.  Constitutional:      General: She is not in acute distress.    Appearance: Normal appearance. She is not ill-appearing.  HENT:     Head: Normocephalic and atraumatic.     Mouth/Throat:     Mouth: Mucous membranes are moist.     Comments: Moist mucous membranes Eyes:     Extraocular Movements: Extraocular movements intact.     Pupils: Pupils are equal, round, and reactive to light.  Cardiovascular:     Rate and Rhythm: Normal rate and regular rhythm.     Heart sounds: Normal heart sounds.  Pulmonary:     Effort: Pulmonary effort is normal.     Breath sounds: Normal breath sounds. No wheezing, rhonchi or rales.  Abdominal:     General: Bowel sounds are normal. There is no distension.     Palpations: Abdomen is soft. There  is no mass.     Tenderness: There is abdominal tenderness. There is left CVA tenderness and guarding. There is no right CVA tenderness or rebound. Positive signs include Murphy's sign.     Comments: The abdomen is diffusely tender to palpation, with positive Murphy sign, guarding, and some rebound.  Skin:    General: Skin is warm.     Capillary Refill: Capillary refill takes less than 2 seconds.     Comments: Good skin turgor  Neurological:     General: No focal deficit present.     Mental Status: She is alert and oriented to person, place, and time.  Psychiatric:        Mood and Affect: Mood normal.        Behavior: Behavior normal.      UC Treatments / Results  Labs (all labs ordered are listed, but only abnormal results are displayed) Labs Reviewed  POCT URINALYSIS DIP (MANUAL ENTRY) - Abnormal; Notable for the following components:      Result Value   Color, UA straw (*)    Clarity, UA cloudy (*)    Spec Grav, UA >=1.030 (*)    Blood, UA trace-lysed (*)    All other components within normal limits  URINE CULTURE  POCT URINE PREGNANCY    EKG   Radiology No results found.  Procedures Procedures (including critical care time)  Medications Ordered in UC Medications - No data to display  Initial Impression / Assessment and Plan / UC Course  I have reviewed the triage vital signs and the nursing notes.  Pertinent labs & imaging results that were available during my care of the patient were reviewed by me and considered in my medical decision making (see chart for details).     Patient is a 28 year old female presenting with abdominal pain and left CVAT.  UA with trace blood, negative nitrate, negative leuk.  Negative urine pregnancy test.  She gave birth 10 months ago, and is not having periods yet. On exam, there is significant abdominal pain, with positive  Murphy sign, guarding, and some rebound.  Chart review indicates she has a history of a hernia.  I recommended  further evaluation in the emergency department setting.  The patient verbalizes understanding.  She is stable for transport to emergency department in POV.  Final Clinical Impressions(s) / UC Diagnoses   Final diagnoses:  Vaginal delivery  Lower abdominal pain     Discharge Instructions      - Please go to the emergency department for your abdominal pain.  I am concerned that how severe the pain is.  It could indicate an issue like infection with your uterus, your gallbladder, your appendix, your intestines, your stomach.  You need imaging and treatment that I cannot perform at urgent care.   ED Prescriptions   None    PDMP not reviewed this encounter.   Arlyss Leita BRAVO, PA-C 05/28/24 1609

## 2024-05-28 NOTE — ED Triage Notes (Addendum)
 Pain in the lower back, states worse with sitting and bending. Worse along the left side. Onset 3 days ago. Also having a headache and left ear pain.   Patient has a 53 week old that she does breast feed.   Tried icy hot spray with no relief.

## 2024-05-30 ENCOUNTER — Encounter (HOSPITAL_COMMUNITY): Payer: Self-pay | Admitting: *Deleted

## 2024-05-30 ENCOUNTER — Other Ambulatory Visit: Payer: Self-pay

## 2024-05-30 ENCOUNTER — Emergency Department (HOSPITAL_COMMUNITY)
Admission: EM | Admit: 2024-05-30 | Discharge: 2024-05-31 | Disposition: A | Discharge status: Home / Self Care | Attending: Emergency Medicine | Admitting: Emergency Medicine

## 2024-05-30 DIAGNOSIS — D72829 Elevated white blood cell count, unspecified: Secondary | ICD-10-CM | POA: Diagnosis not present

## 2024-05-30 DIAGNOSIS — M545 Low back pain, unspecified: Secondary | ICD-10-CM | POA: Insufficient documentation

## 2024-05-30 DIAGNOSIS — R1032 Left lower quadrant pain: Secondary | ICD-10-CM | POA: Insufficient documentation

## 2024-05-30 DIAGNOSIS — M549 Dorsalgia, unspecified: Secondary | ICD-10-CM | POA: Diagnosis present

## 2024-05-30 LAB — COMPREHENSIVE METABOLIC PANEL WITH GFR
ALT: 21 U/L (ref 0–44)
AST: 20 U/L (ref 15–41)
Albumin: 3.6 g/dL (ref 3.5–5.0)
Alkaline Phosphatase: 88 U/L (ref 38–126)
Anion gap: 10 (ref 5–15)
BUN: 12 mg/dL (ref 6–20)
CO2: 25 mmol/L (ref 22–32)
Calcium: 8.8 mg/dL — ABNORMAL LOW (ref 8.9–10.3)
Chloride: 101 mmol/L (ref 98–111)
Creatinine, Ser: 0.59 mg/dL (ref 0.44–1.00)
GFR, Estimated: >60 mL/min (ref >60)
Glucose, Bld: 92 mg/dL (ref 70–99)
Potassium: 4.1 mmol/L (ref 3.5–5.1)
Sodium: 136 mmol/L (ref 135–145)
Total Bilirubin: 0.5 mg/dL (ref 0.0–1.2)
Total Protein: 8 g/dL (ref 6.5–8.1)

## 2024-05-30 LAB — CBC
HCT: 39 % (ref 36.0–46.0)
Hemoglobin: 12.8 g/dL (ref 12.0–15.0)
MCH: 30.5 pg (ref 26.0–34.0)
MCHC: 32.8 g/dL (ref 30.0–36.0)
MCV: 92.9 fL (ref 80.0–100.0)
Platelets: 462 K/uL — ABNORMAL HIGH (ref 150–400)
RBC: 4.2 MIL/uL (ref 3.87–5.11)
RDW: 12.9 % (ref 11.5–15.5)
WBC: 11.2 K/uL — ABNORMAL HIGH (ref 4.0–10.5)
nRBC: 0 % (ref 0.0–0.2)

## 2024-05-30 LAB — URINALYSIS, ROUTINE W REFLEX MICROSCOPIC
Bacteria, UA: NONE SEEN
Bilirubin Urine: NEGATIVE
Glucose, UA: NEGATIVE mg/dL
Ketones, ur: NEGATIVE mg/dL
Leukocytes,Ua: NEGATIVE
Nitrite: NEGATIVE
Protein, ur: NEGATIVE mg/dL
Specific Gravity, Urine: 1.019 (ref 1.005–1.030)
pH: 6 (ref 5.0–8.0)

## 2024-05-30 LAB — HCG, SERUM, QUALITATIVE: Preg, Serum: NEGATIVE

## 2024-05-30 NOTE — ED Triage Notes (Addendum)
 PT states sacral pain that radiates to L lower abdomen.  Denies urinary s/s, denies changes in bowel habits.  Pregnancy test yesterday was neg.    Note from UC states that pt is 10 weeks postpartum, but pt states she is 10 months postpartum.    Pt also c/o headache x 3 days not improved with ibuprofen .

## 2024-05-30 NOTE — ED Triage Notes (Signed)
 Pt reports low back pain that has increased since UC visit yesterday. 10 weeks postpartum. Pt states that she is unable to sit.

## 2024-05-31 ENCOUNTER — Emergency Department (HOSPITAL_COMMUNITY)

## 2024-05-31 LAB — URINE CULTURE: Culture: <10000 — AB

## 2024-05-31 MED ORDER — ONDANSETRON HCL 4 MG/2ML IJ SOLN: 4.0000 mg | Freq: Once | INTRAMUSCULAR | Status: DC

## 2024-05-31 MED ORDER — KETOROLAC TROMETHAMINE 15 MG/ML IJ SOLN
15.0000 mg | Freq: Once | INTRAMUSCULAR | Status: AC
  Administered 2024-05-31: 15 mg via INTRAMUSCULAR
  Filled 2024-05-31: qty 1

## 2024-05-31 MED ORDER — MORPHINE SULFATE (PF) 4 MG/ML IV SOLN: 4.0000 mg | Freq: Once | INTRAVENOUS | Status: DC

## 2024-05-31 NOTE — Discharge Instructions (Addendum)

## 2024-05-31 NOTE — ED Notes (Signed)
 Patient transported to CT

## 2024-05-31 NOTE — ED Provider Notes (Signed)
 [place] EMERGENCY DEPARTMENT AT [facility] Provider Note   CSN: [id number] Arrival date & time: 05/30/24  1857     Patient presents with: Back Pain, Tailbone Pain, and Abdominal Pain   Tara Chapman is a 28 y.o. female with non-contributory PMHx who presents to ED concerned for left sided back pain x3 days. Patient stating that pain is radiating towards LLQ. Patient has not taken any medications for pain recently. Patient endorses hx of low back pain with sciatica, but denies radiation of pain down left leg today.   Patient denies saddle anesthesia, lower extremity weakness, hx of cancer, fever, immunosuppression, IVDU, spinal procedure, significant trauma. Denies fever, cough, chest pain, dyspnea, nausea, vomiting, diarrhea, hematuria, dysuria, hematochezia.    Print production planner assisted with interview, Cathryn [phone].     Back Pain Associated symptoms: abdominal pain   Abdominal Pain      Prior to Admission medications   Medication Sig Start Date End Date Taking? Authorizing Provider  ferrous sulfate  325 (65 FE) MG tablet Take 1 tablet (325 mg total) by mouth every other day. 07/30/23   Jhonny Augustin BROCKS, MD  Prenatal Vit-Fe Fumarate-FA (PREPLUS) 27-1 MG TABS Take 1 tablet by mouth daily. 12/22/22   Weinhold, Samantha C, CNM    Allergies: Bovine (beef) protein-containing drug products and Porcine (pork) protein-containing drug products    Review of Systems  Gastrointestinal:  Positive for abdominal pain.  Musculoskeletal:  Positive for back pain.    Updated Vital Signs BP 121/82 (BP Location: Right Arm)   Pulse 83   Temp 98.9 F (37.2 C)   Resp 15   Ht 5' 5 (1.651 m)   Wt 85.5 kg   LMP  (LMP Unknown)   SpO2 100%   Breastfeeding Yes   BMI 31.37 kg/m   Physical Exam Vitals and nursing note reviewed.  Constitutional:      General: She is not in acute distress.    Appearance: She is not ill-appearing or toxic-appearing.  HENT:      Head: Normocephalic and atraumatic.     Mouth/Throat:     Mouth: Mucous membranes are moist.     Pharynx: No posterior oropharyngeal erythema.  Eyes:     General: No scleral icterus.       Right eye: No discharge.        Left eye: No discharge.     Conjunctiva/sclera: Conjunctivae normal.  Cardiovascular:     Rate and Rhythm: Normal rate and regular rhythm.     Pulses: Normal pulses.     Heart sounds: Normal heart sounds. No murmur heard. Pulmonary:     Effort: Pulmonary effort is normal. No respiratory distress.     Breath sounds: Normal breath sounds. No wheezing, rhonchi or rales.  Abdominal:     General: Abdomen is flat. Bowel sounds are normal. There is no distension.     Palpations: Abdomen is soft. There is no mass.     Tenderness: There is abdominal tenderness in the left lower quadrant.  Musculoskeletal:     Right lower leg: No edema.     Left lower leg: No edema.     Comments: Tenderness to palpation of left lumbar paraspinal muscles. No midline tenderness. +2 pedal pulses BL. Sensation to light touch intact in BL LE.  Skin:    General: Skin is warm and dry.     Findings: No rash.  Neurological:     General: No focal deficit present.  Mental Status: She is alert. Mental status is at baseline.  Psychiatric:        Mood and Affect: Mood normal.     (all labs ordered are listed, but only abnormal results are displayed) Labs Reviewed  COMPREHENSIVE METABOLIC PANEL WITH GFR - Abnormal; Notable for the following components:      Result Value   Calcium 8.8 (*)    All other components within normal limits  CBC - Abnormal; Notable for the following components:   WBC 11.2 (*)    Platelets 462 (*)    All other components within normal limits  URINALYSIS, ROUTINE W REFLEX MICROSCOPIC - Abnormal; Notable for the following components:   Hgb urine dipstick SMALL (*)    All other components within normal limits  HCG, SERUM, QUALITATIVE    EKG: None  Radiology: CT  Renal Stone Study Result Date: 05/31/2024 CLINICAL DATA:  Abdominal pain EXAM: CT ABDOMEN AND PELVIS WITHOUT CONTRAST TECHNIQUE: Multidetector CT imaging of the abdomen and pelvis was performed following the standard protocol without IV contrast. RADIATION DOSE REDUCTION: This exam was performed according to the departmental dose-optimization program which includes automated exposure control, adjustment of the mA and/or kV according to patient size and/or use of iterative reconstruction technique. COMPARISON:  04/28/2020 FINDINGS: Lower chest: No acute abnormality. Hepatobiliary: No focal liver abnormality is seen. No gallstones, gallbladder wall thickening, or biliary dilatation. Pancreas: Unremarkable. No pancreatic ductal dilatation or surrounding inflammatory changes. Spleen: Normal in size without focal abnormality. Adrenals/Urinary Tract: Adrenal glands are within normal limits. Kidneys are well visualized bilaterally. No renal calculi or obstructive changes are seen. The ureters are within normal limits. Bladder is partially distended. Stomach/Bowel: Scattered diverticular change of the colon is noted. No obstructive changes are seen. Appendix is well visualized and within normal limits. Small bowel and stomach are unremarkable. Vascular/Lymphatic: No significant vascular findings are present. No enlarged abdominal or pelvic lymph nodes. Reproductive: Uterus and bilateral adnexa are unremarkable. Other: No abdominal wall hernia or abnormality. No abdominopelvic ascites. Musculoskeletal: No acute or significant osseous findings. IMPRESSION: Diverticulosis without diverticulitis. No renal calculi or obstructive changes are noted. Electronically Signed   By: Oneil Devonshire M.D.   On: 05/31/2024 03:09     .Ultrasound ED Peripheral IV (Provider)  Date/Time: 05/31/2024 2:49 AM  Performed by: Hoy Nidia FALCON, PA-C Authorized by: Hoy Nidia FALCON, PA-C   Procedure details:    Indications: multiple  failed IV attempts     Skin Prep: chlorhexidine gluconate     Location:  Right AC   Angiocath:  20 G   Bedside Ultrasound Guided: Yes     Images: not archived     Patient tolerated procedure without complications: Yes   Comments:     Unsuccessful, vein was blown    Medications Ordered in the ED  ketorolac (TORADOL) 15 MG/ML injection 15 mg (15 mg Intramuscular Given 05/31/24 0320)                                    Medical Decision Making Amount and/or Complexity of Data Reviewed Labs: ordered. Radiology: ordered.  Risk Prescription drug management.    This patient presents to the ED for concern of abdominal pain, this involves an extensive number of treatment options, and is a complaint that carries with it a high risk of complications and morbidity.  The differential diagnosis includes gastroenteritis, colitis, small bowel obstruction, appendicitis, cholecystitis,  pancreatitis, nephrolithiasis, UTI, pyelonephritis   Co morbidities that complicate the patient evaluation  none   Additional history obtained:  Additional history obtained from UC note from earlier today: patient seen for back pain and abdominal pain. Referred to ED.    Problem List / ED Course / Critical interventions / Medication management  Patient presented for abdominal pain and back pain.  Physical exam with left lumbar paraspinal tenderness to palpation and LLQ tenderness.  Rest of physical exam reassuring.  Patient afebrile with stable vitals. Patient has not been taking OTC medications at home for pain.  I Ordered, and personally interpreted labs.  hCG negative.  CBC with mild leukocytosis at 11.2.  No anemia.  CMP reassuring.  UA not concerning for infection. I ordered imaging studies including CT Renal Study: due to favored nephrolithiasis over GI etiology for patient's abdominal pain. I independently visualized and interpreted imaging and I agree with the radiologist interpretation of no acute  process. Shared all results with patient.  Answered all questions.  Provided patient with a dose of IM Toradol.  Patient is starting to feel better.  I have a high suspicion that pain is related to a muscle strain.  UA does have a small amount of hemoglobin -very small kidney stone could also be cause of patient's pain today.  Educated patient on alternating Advil  and Tylenol  for pain.  Recommended following up with PCP.  Patient agreeable to plan. I have reviewed the patients home medicines and have made adjustments as needed The patient has been appropriately medically screened and/or stabilized in the ED. I have low suspicion for any other emergent medical condition which would require further screening, evaluation or treatment in the ED or require inpatient management. At time of discharge the patient is hemodynamically stable and in no acute distress. I have discussed work-up results and diagnosis with patient and answered all questions. Patient is agreeable with discharge plan. We discussed strict return precautions for returning to the emergency department and they verbalized understanding.    Social Determinants of Health:  Foreign language       Final diagnoses:  Acute left-sided low back pain without sciatica  Left lower quadrant abdominal pain    ED Discharge Orders     None          Hoy Nidia FALCON, [place] 05/31/24 0419    Palumbo, April, MD 05/31/24 [phone]

## 2024-06-01 ENCOUNTER — Ambulatory Visit (HOSPITAL_COMMUNITY): Payer: Self-pay
